# Patient Record
Sex: Female | Born: 2000 | Race: White | Hispanic: Yes | Marital: Single | Smoking: Never smoker
Health system: Southern US, Community
[De-identification: ages and names within clinical notes are randomized; demographics above are authoritative.]

## PROBLEM LIST (undated history)

## (undated) DIAGNOSIS — R519 Headache, unspecified: Secondary | ICD-10-CM

## (undated) DIAGNOSIS — K5900 Constipation, unspecified: Secondary | ICD-10-CM

## (undated) DIAGNOSIS — T7840XA Allergy, unspecified, initial encounter: Secondary | ICD-10-CM

## (undated) DIAGNOSIS — R51 Headache: Secondary | ICD-10-CM

## (undated) DIAGNOSIS — J302 Other seasonal allergic rhinitis: Secondary | ICD-10-CM

## (undated) HISTORY — DX: Allergy, unspecified, initial encounter: T78.40XA

## (undated) HISTORY — PX: UPPER GI ENDOSCOPY: SHX6162

## (undated) HISTORY — DX: Headache: R51

## (undated) HISTORY — DX: Constipation, unspecified: K59.00

## (undated) HISTORY — PX: NO PAST SURGERIES: SHX2092

## (undated) HISTORY — DX: Headache, unspecified: R51.9

---
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
## 2021-02-02 ENCOUNTER — Encounter (HOSPITAL_BASED_OUTPATIENT_CLINIC_OR_DEPARTMENT_OTHER): Payer: Self-pay

## 2021-02-02 ENCOUNTER — Other Ambulatory Visit: Payer: Self-pay

## 2021-02-02 DIAGNOSIS — R509 Fever, unspecified: Secondary | ICD-10-CM | POA: Diagnosis present

## 2021-02-02 DIAGNOSIS — Z20822 Contact with and (suspected) exposure to covid-19: Secondary | ICD-10-CM | POA: Insufficient documentation

## 2021-02-02 DIAGNOSIS — J101 Influenza due to other identified influenza virus with other respiratory manifestations: Secondary | ICD-10-CM | POA: Diagnosis not present

## 2021-02-02 LAB — RESP PANEL BY RT-PCR (FLU A&B, COVID) ARPGX2
Influenza A by PCR: POSITIVE — AB
Influenza B by PCR: NEGATIVE
SARS Coronavirus 2 by RT PCR: NEGATIVE

## 2021-02-02 NOTE — ED Triage Notes (Signed)
Patient here POV from Home with Cough, Sore Throat, Fever, Body Aches, and Nausea.  1G of Tylenol taken at 1900.  Symptoms began Monday and have not subsided Since.Subjective Fevers at Home. NAD Noted during Triage. A&Ox4. GCS 15.

## 2021-02-03 ENCOUNTER — Emergency Department (HOSPITAL_BASED_OUTPATIENT_CLINIC_OR_DEPARTMENT_OTHER)
Admission: EM | Admit: 2021-02-03 | Discharge: 2021-02-03 | Disposition: A | Discharge status: Home / Self Care | Payer: Medicaid Other | Attending: Emergency Medicine | Admitting: Emergency Medicine

## 2021-02-03 DIAGNOSIS — J101 Influenza due to other identified influenza virus with other respiratory manifestations: Secondary | ICD-10-CM

## 2021-02-03 MED ORDER — GUAIFENESIN 100 MG/5ML PO LIQD: 100.0000 mg | ORAL | 0 refills | Status: DC | PRN

## 2021-02-03 MED ORDER — ONDANSETRON HCL 4 MG PO TABS
4.0000 mg | ORAL_TABLET | Freq: Three times a day (TID) | ORAL | 0 refills | Status: DC | PRN
Start: 2021-02-03 — End: 2021-03-16

## 2021-02-03 MED ORDER — ACETAMINOPHEN 500 MG PO TABS
1000.0000 mg | ORAL_TABLET | Freq: Once | ORAL | Status: AC
  Administered 2021-02-03: 1000 mg via ORAL
  Filled 2021-02-03: qty 2

## 2021-02-03 NOTE — ED Provider Notes (Signed)
[facility] EMERGENCY DEPT Provider Note  CSN: [id number] Arrival date & time: 02/02/21 2231  Chief Complaint(s) Fever  HPI [name] is a 20 y.o. female    Fever Temp source:  Oral Severity:  Moderate Onset quality:  Gradual Duration:  3 days Timing:  Constant Progression:  Waxing and waning Chronicity:  New Relieved by:  Acetaminophen Worsened by:  Nothing Associated symptoms: chills, congestion, cough, headaches, myalgias, rhinorrhea and vomiting   Risk factors: sick contacts    Past Medical History Past Medical History:  Diagnosis Date   Allergy    Phreesia 09/18/2019   Constipation    Headache    Seasonal allergies    Patient Active Problem List   Diagnosis Date Noted   Chest pain 06/28/2020   Swelling of both hands 09/18/2019   Bilateral swelling of feet 09/18/2019   Eosinophilic esophagitis 05/23/2018   Viral URI 05/20/2018   Vaccine reaction, initial encounter 03/18/2018   Generalized abdominal pain 01/10/2018   Gastroesophageal reflux disease 01/10/2018   Migraine without aura and without status migrainosus, not intractable 01/05/2016   Poor posture 12/01/2015   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 Authorizing Provider  guaiFENesin (ROBITUSSIN) 100 MG/5ML liquid Take 5-10 mLs (100-200 mg total) by mouth every 4 (four) hours as needed for cough or to loosen phlegm. 02/03/21  Yes Cheney Gosch, Amadeo Garnet, MD  cetirizine (ZYRTEC ALLERGY) 10 MG tablet Take 1 tablet (10 mg total) by mouth daily. 11/20/20   Rhys Martini, PA-C  fluconazole (DIFLUCAN) 150 MG tablet Take 1 tablet (150 mg total) by mouth daily. Take 1 pill today, and second pill on day 3 11/20/20   Rhys Martini, PA-C  fluticasone [facility]) 50 MCG/ACT nasal spray Place 2 sprays into both nostrils daily. 11/20/20   Rhys Martini, PA-C   metroNIDAZOLE (FLAGYL) 500 MG tablet Take 1 tablet (500 mg total) by mouth 2 (two) times daily. 11/20/20   Rhys Martini, PA-C  ondansetron (ZOFRAN) 4 MG tablet Take 1 tablet (4 mg total) by mouth every 8 (eight) hours as needed for nausea or vomiting. 02/03/21   CardamaAmadeo Garnet, MD                                                                                                                                    Past Surgical History Past Surgical History:  Procedure Laterality Date   NO PAST SURGERIES     UPPER GI ENDOSCOPY     Family History Family History  Problem Relation Age of Onset   Hypertension Mother    Liver disease Father    Hyperlipidemia Father    Cancer Maternal Grandmother    Cancer Maternal Grandfather    Cirrhosis Paternal Grandfather    Gallbladder disease Sister    GI  problems Neg Hx     Social History Social History   Tobacco Use   Smoking status: Never   Smokeless tobacco: Never  Vaping Use   Vaping Use: Never used  Substance Use Topics   Alcohol use: No   Drug use: No   Allergies Salami [pickled meat] and Other  Review of Systems Review of Systems  Constitutional:  Positive for chills and fever.  HENT:  Positive for congestion and rhinorrhea.   Respiratory:  Positive for cough.   Gastrointestinal:  Positive for vomiting.  Musculoskeletal:  Positive for myalgias.  Neurological:  Positive for headaches.  All other systems are reviewed and are negative for acute change except as noted in the HPI  Physical Exam Vital Signs  I have reviewed the triage vital signs BP 117/60 (BP Location: Right Arm)   Pulse (!) 139   Temp (!) 100.9 F (38.3 C) (Oral)   Resp 18   SpO2 100%   Physical Exam Vitals reviewed.  Constitutional:      General: She is not in acute distress.    Appearance: She is well-developed. She is not diaphoretic.  HENT:     Head: Normocephalic and atraumatic.     Nose: Nose normal.  Eyes:     General: No scleral  icterus.       Right eye: No discharge.        Left eye: No discharge.     Conjunctiva/sclera: Conjunctivae normal.     Pupils: Pupils are equal, round, and reactive to light.  Cardiovascular:     Rate and Rhythm: Normal rate and regular rhythm.     Heart sounds: No murmur heard.   No friction rub. No gallop.  Pulmonary:     Effort: Pulmonary effort is normal. No respiratory distress.     Breath sounds: Normal breath sounds. No stridor. No rales.  Abdominal:     General: There is no distension.     Palpations: Abdomen is soft.     Tenderness: There is no abdominal tenderness.  Musculoskeletal:        General: No tenderness.     Cervical back: Normal range of motion and neck supple.  Skin:    General: Skin is warm and dry.     Findings: No erythema or rash.  Neurological:     Mental Status: She is alert and oriented to person, place, and time.    ED Results and Treatments Labs (all labs ordered are listed, but only abnormal results are displayed) Labs Reviewed  RESP PANEL BY RT-PCR (FLU A&B, COVID) ARPGX2 - Abnormal; Notable for the following components:      Result Value   Influenza A by PCR POSITIVE (*)    All other components within normal limits                                                                                                                         EKG  EKG Interpretation  Date/Time:  Ventricular Rate:    PR Interval:    QRS Duration:   QT Interval:    QTC Calculation:   R Axis:     Text Interpretation:         Radiology No results found.  Pertinent labs & imaging results that were available during my care of the patient were reviewed by me and considered in my medical decision making (see MDM for details).  Medications Ordered in ED Medications  acetaminophen (TYLENOL) tablet 1,000 mg (has no administration in time range)                                                                                                                                      Procedures Procedures  (including critical care time)  Medical Decision Making / ED Course I have reviewed the nursing notes for this encounter and the patient's prior records (if available in EHR or on provided paperwork).  Dawn [name] was evaluated in Emergency Department on 02/03/2021 for the symptoms described in the history of present illness. She was evaluated in the context of the global COVID-19 pandemic, which necessitated consideration that the patient might be at risk for infection with the SARS-CoV-2 virus that causes COVID-19. Institutional protocols and algorithms that pertain to the evaluation of patients at risk for COVID-19 are in a state of rapid change based on information released by regulatory bodies including the CDC and federal and state organizations. These policies and algorithms were followed during the patient's care in the ED.     20 y.o. female presents with flu-like symptoms for 3 days. Adequate oral hydration. Rest of history as above.  Patient appears well. No signs of toxicity, patient is interactive. No hypoxia, tachypnea or other signs of respiratory distress. No sign of clinical dehydration. Lung exam clear. Rest of exam as above.  Most consistent with flu-like illness. Influenza A +.   No evidence suggestive of pharyngitis, AOM, PNA, or meningitis.  Chest x-ray not indicated at this time.  Discussed symptomatic treatment with the patient and they will follow closely with their PCP.   Pertinent labs & imaging results that were available during my care of the patient were reviewed by me and considered in my medical decision making:    Final Clinical Impression(s) / ED Diagnoses Final diagnoses:  Influenza A   The patient appears reasonably screened and/or stabilized for discharge and I doubt any other medical condition or other [facility] requiring further screening, evaluation, or treatment in the ED at this time prior to discharge.  Safe for discharge with strict return precautions.  Disposition: Discharge  Condition: Good  I have discussed the results, Dx and Tx plan with the patient/family who expressed understanding and agree(s) with the plan. Discharge instructions discussed at length. The patient/family was given strict return precautions who verbalized understanding of the instructions. No further questions at time of discharge.    ED  Discharge Orders          Ordered    ondansetron (ZOFRAN) 4 MG tablet  Every 8 hours PRN        02/03/21 0312    guaiFENesin (ROBITUSSIN) 100 MG/5ML liquid  Every 4 hours PRN        02/03/21 1610              Follow Up: Theadore Nan, MD [address] [place] [address] [place] [address] [phone]  Call  to schedule an appointment for close follow up     This chart was dictated using voice recognition software.  Despite best efforts to proofread,  errors can occur which can change the documentation meaning.    Nira Conn, MD 02/03/21 [phone]

## 2021-02-03 NOTE — Discharge Instructions (Addendum)
You may take over-the-counter medicine for symptomatic relief, such as Tylenol, Motrin, TheraFlu, Alka seltzer , black elderberry, etc. Please limit acetaminophen (Tylenol) to 4000 mg and Ibuprofen (Motrin, Advil, etc.) to 2400 mg for a 24hr period. Please note that other over-the-counter medicine may contain acetaminophen or ibuprofen as a component of their ingredients.   

## 2021-02-04 ENCOUNTER — Telehealth: Payer: Self-pay

## 2021-02-04 ENCOUNTER — Other Ambulatory Visit: Payer: Self-pay

## 2021-02-04 ENCOUNTER — Encounter (HOSPITAL_BASED_OUTPATIENT_CLINIC_OR_DEPARTMENT_OTHER): Payer: Self-pay

## 2021-02-04 DIAGNOSIS — R197 Diarrhea, unspecified: Secondary | ICD-10-CM | POA: Insufficient documentation

## 2021-02-04 DIAGNOSIS — R519 Headache, unspecified: Secondary | ICD-10-CM | POA: Diagnosis present

## 2021-02-04 DIAGNOSIS — R1110 Vomiting, unspecified: Secondary | ICD-10-CM | POA: Insufficient documentation

## 2021-02-04 DIAGNOSIS — J111 Influenza due to unidentified influenza virus with other respiratory manifestations: Secondary | ICD-10-CM | POA: Diagnosis not present

## 2021-02-04 DIAGNOSIS — J101 Influenza due to other identified influenza virus with other respiratory manifestations: Secondary | ICD-10-CM | POA: Diagnosis not present

## 2021-02-04 NOTE — ED Triage Notes (Signed)
Pt was dx with Flu A two days ago and present for ongoing symptoms. Pt c/o nausea/vomiting/diarrhea and headache with fever. Pt has taken zofran and tylenol with minimal relief. Pt states she has not been able to eat or drink anything in two days.

## 2021-02-04 NOTE — Telephone Encounter (Signed)
Transition Care Management Follow-up Telephone Call Date of discharge and from where: 02/03/2021-[facility]  How have you been since you were released from the hospital? Patient stated she is doing ok, resting to get better. Any questions or concerns? No  Items Reviewed: Did the pt receive and understand the discharge instructions provided? Yes  Medications obtained and verified? Yes  Other? No  Any new allergies since your discharge? No  Dietary orders reviewed? No Do you have support at home? Yes   Home Care and Equipment/Supplies: Were home health services ordered? not applicable If so, what is the name of the agency? N/A  Has the agency set up a time to come to the patient's home? not applicable Were any new equipment or medical supplies ordered?  No What is the name of the medical supply agency? N/A Were you able to get the supplies/equipment? not applicable Do you have any questions related to the use of the equipment or supplies? No  Functional Questionnaire: (I = Independent and D = Dependent) ADLs: I  Bathing/Dressing- I  Meal Prep- I  Eating- I  Maintaining continence- I  Transferring/Ambulation- I  Managing Meds- I  Follow up appointments reviewed:  PCP Hospital f/u appt confirmed? No   Specialist Hospital f/u appt confirmed? No   Are transportation arrangements needed? No  If their condition worsens, is the pt aware to call PCP or go to the Emergency Dept.? Yes Was the patient provided with contact information for the PCP's office or ED? Yes Was to pt encouraged to call back with questions or concerns? Yes

## 2021-02-05 ENCOUNTER — Emergency Department (HOSPITAL_BASED_OUTPATIENT_CLINIC_OR_DEPARTMENT_OTHER): Payer: Medicaid Other

## 2021-02-05 ENCOUNTER — Emergency Department (HOSPITAL_BASED_OUTPATIENT_CLINIC_OR_DEPARTMENT_OTHER)
Admission: EM | Admit: 2021-02-05 | Discharge: 2021-02-05 | Disposition: A | Discharge status: Home / Self Care | Payer: Medicaid Other | Attending: Emergency Medicine | Admitting: Emergency Medicine

## 2021-02-05 DIAGNOSIS — R197 Diarrhea, unspecified: Secondary | ICD-10-CM

## 2021-02-05 DIAGNOSIS — J111 Influenza due to unidentified influenza virus with other respiratory manifestations: Secondary | ICD-10-CM | POA: Diagnosis not present

## 2021-02-05 DIAGNOSIS — R1110 Vomiting, unspecified: Secondary | ICD-10-CM

## 2021-02-05 LAB — BASIC METABOLIC PANEL
Anion gap: 10 (ref 5–15)
BUN: 8 mg/dL (ref 6–20)
CO2: 28 mmol/L (ref 22–32)
Calcium: 9 mg/dL (ref 8.9–10.3)
Chloride: 100 mmol/L (ref 98–111)
Creatinine, Ser: 0.73 mg/dL (ref 0.44–1.00)
GFR, Estimated: >60 mL/min (ref >60)
Glucose, Bld: 97 mg/dL (ref 70–99)
Potassium: 3.4 mmol/L — ABNORMAL LOW (ref 3.5–5.1)
Sodium: 138 mmol/L (ref 135–145)

## 2021-02-05 LAB — CBC WITH DIFFERENTIAL/PLATELET
Abs Immature Granulocytes: 0.01 10*3/uL (ref 0.00–0.07)
Basophils Absolute: 0 10*3/uL (ref 0.0–0.1)
Basophils Relative: 0 %
Eosinophils Absolute: 0 10*3/uL (ref 0.0–0.5)
Eosinophils Relative: 1 %
HCT: 41.8 % (ref 36.0–46.0)
Hemoglobin: 13.8 g/dL (ref 12.0–15.0)
Immature Granulocytes: 0 %
Lymphocytes Relative: 28 %
Lymphs Abs: 1 10*3/uL (ref 0.7–4.0)
MCH: 29.9 pg (ref 26.0–34.0)
MCHC: 33 g/dL (ref 30.0–36.0)
MCV: 90.7 fL (ref 80.0–100.0)
Monocytes Absolute: 0.3 10*3/uL (ref 0.1–1.0)
Monocytes Relative: 8 %
Neutro Abs: 2.2 10*3/uL (ref 1.7–7.7)
Neutrophils Relative %: 63 %
Platelets: 245 10*3/uL (ref 150–400)
RBC: 4.61 MIL/uL (ref 3.87–5.11)
RDW: 11.9 % (ref 11.5–15.5)
WBC: 3.6 10*3/uL — ABNORMAL LOW (ref 4.0–10.5)
nRBC: 0 % (ref 0.0–0.2)

## 2021-02-05 LAB — HCG, QUANTITATIVE, PREGNANCY: hCG, Beta Chain, Quant, S: <1 m[IU]/mL (ref <5)

## 2021-02-05 MED ORDER — KETOROLAC TROMETHAMINE 30 MG/ML IJ SOLN
30.0000 mg | Freq: Once | INTRAMUSCULAR | Status: AC
  Administered 2021-02-05: 30 mg via INTRAVENOUS
  Filled 2021-02-05: qty 1

## 2021-02-05 MED ORDER — SODIUM CHLORIDE 0.9 % IV BOLUS
1000.0000 mL | Freq: Once | INTRAVENOUS | Status: AC
  Administered 2021-02-05: 1000 mL via INTRAVENOUS

## 2021-02-05 MED ORDER — ONDANSETRON HCL 4 MG/2ML IJ SOLN
4.0000 mg | Freq: Once | INTRAMUSCULAR | Status: AC
  Administered 2021-02-05: 4 mg via INTRAVENOUS
  Filled 2021-02-05: qty 2

## 2021-02-05 MED ORDER — ACETAMINOPHEN 325 MG PO TABS
650.0000 mg | ORAL_TABLET | Freq: Once | ORAL | Status: AC
  Administered 2021-02-05: 650 mg via ORAL
  Filled 2021-02-05: qty 2

## 2021-02-05 NOTE — ED Provider Notes (Signed)
[facility] EMERGENCY DEPT Provider Note   CSN: [id number] Arrival date & time: 02/04/21  2323     History Chief Complaint  Patient presents with   Emesis   Diarrhea   Headache    Dawn [name] is a 20 y.o. female.  The history is provided by the patient.  Influenza Presenting symptoms: cough, diarrhea, fatigue, fever, headache, myalgias, nausea, shortness of breath and vomiting   Severity:  Moderate Onset quality:  Gradual Progression:  Worsening Chronicity:  New Relieved by:  Nothing Worsened by:  Nothing Associated symptoms: chills and decreased appetite   Patient is an otherwise healthy female who presents with flulike illness.  Patient reports her symptoms started over 5 days ago.  She reports cough, fever, chills and headache.  She was seen in the ER and diagnosed with flu on 226.  Since that time she has had worsening symptoms including vomiting and diarrhea.  She reports she is unable to take any oral fluids.     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     Family History  Problem Relation Age of Onset   Hypertension Mother    Liver disease Father    Hyperlipidemia Father     Cancer Maternal Grandmother    Cancer Maternal Grandfather    Cirrhosis Paternal Grandfather    Gallbladder disease Sister    GI problems Neg Hx     Social History   Tobacco Use   Smoking status: Never   Smokeless tobacco: Never  Vaping Use   Vaping Use: Never used  Substance Use Topics   Alcohol use: No   Drug use: No    Home Medications Prior to Admission medications   Medication Sig Start Date End Date Taking? Authorizing Provider  cetirizine (ZYRTEC ALLERGY) 10 MG tablet Take 1 tablet (10 mg total) by mouth daily. 11/20/20   Rhys Martini, PA-C  fluticasone (FLONASE) 50 MCG/ACT nasal spray Place 2 sprays into both nostrils daily. 11/20/20   Rhys Martini, PA-C  guaiFENesin (ROBITUSSIN) 100 MG/5ML liquid Take 5-10 mLs (100-200 mg total) by mouth every 4 (four) hours as needed for cough or to loosen phlegm. 02/03/21   Nira Conn, MD  ondansetron (ZOFRAN) 4 MG tablet Take 1 tablet (4 mg total) by mouth every 8 (eight) hours as needed for nausea or vomiting. 02/03/21   Cardama, Amadeo Garnet, MD    Allergies    Salami [pickled meat] and Other  Review of Systems   Review of Systems  Constitutional:  Positive for chills, decreased appetite, fatigue and fever.  Respiratory:  Positive for cough and shortness of breath.  Gastrointestinal:  Positive for diarrhea, nausea and vomiting.  Musculoskeletal:  Positive for myalgias.  Neurological:  Positive for headaches.  All other systems reviewed and are negative.  Physical Exam Updated Vital Signs BP 104/78 (BP Location: Right Arm)   Pulse 89   Temp 99.2 F (37.3 C) (Oral)   Resp 18   Ht 1.575 m (5\' 2" )   Wt 77.1 kg   LMP 01/21/2021 (Approximate)   SpO2 98%   BMI 31.09 kg/m   Physical Exam CONSTITUTIONAL: Well developed/well nourished HEAD: Normocephalic/atraumatic EYES: EOMI/PERRL ENMT: Mucous membranes moist, uvula midline, no stridor or drooling NECK: supple no meningeal signs SPINE/BACK:entire  spine nontender CV: S1/S2 noted, no murmurs/rubs/gallops noted LUNGS: Lungs are clear to auscultation bilaterally, no apparent distress ABDOMEN: soft, nontender, no rebound or guarding, bowel sounds noted throughout abdomen GU:no cva tenderness NEURO: Pt is awake/alert/appropriate, moves all extremitiesx4.  No facial droop.   EXTREMITIES: pulses normal/equal, full ROM SKIN: warm, color normal PSYCH: no abnormalities of mood noted, alert and oriented to situation  ED Results / Procedures / Treatments   Labs (all labs ordered are listed, but only abnormal results are displayed) Labs Reviewed  BASIC METABOLIC PANEL - Abnormal; Notable for the following components:      Result Value   Potassium 3.4 (*)    All other components within normal limits  CBC WITH DIFFERENTIAL/PLATELET - Abnormal; Notable for the following components:   WBC 3.6 (*)    All other components within normal limits  HCG, QUANTITATIVE, PREGNANCY    EKG None  Radiology DG Chest Portable 1 View  Result Date: 02/05/2021 CLINICAL DATA:  Influenza EXAM: PORTABLE CHEST 1 VIEW COMPARISON:  12/22/2019 FINDINGS: Normal heart size and mediastinal contours. No acute infiltrate or edema. No effusion or pneumothorax. No acute osseous findings. IMPRESSION: Negative chest. Electronically Signed   By: 12/24/2019 M.D.   On: 02/05/2021 04:54    Procedures Procedures   Medications Ordered in ED Medications  sodium chloride 0.9 % bolus 1,000 mL (0 mLs Intravenous Stopped 02/05/21 0413)  ondansetron (ZOFRAN) injection 4 mg (4 mg Intravenous Given 02/05/21 0344)  ketorolac (TORADOL) 30 MG/ML injection 30 mg (30 mg Intravenous Given 02/05/21 0413)  sodium chloride 0.9 % bolus 1,000 mL (0 mLs Intravenous Stopped 02/05/21 0630)  acetaminophen (TYLENOL) tablet 650 mg (650 mg Oral Given 02/05/21 0535)    ED Course  I have reviewed the triage vital signs and the nursing notes.  Pertinent labs & imaging results that were  available during my care of the patient were reviewed by me and considered in my medical decision making (see chart for details).    MDM Rules/Calculators/A&P                           Patient is a flulike symptoms for several days.  She was are seen in the ER for symptoms but worsened at home.  She also reports associated vomiting and diarrhea.  Patient did appear mildly dehydrated.  She was given 2 L of IV fluid is now feeling improved.  She is ambulatory and is now producing urine. I personally reviewed her chest x-ray and is negative Patient is otherwise low risk and appropriate for outpatient management. Final Clinical Impression(s) / ED Diagnoses Final diagnoses:  Influenza  Vomiting and diarrhea    Rx / DC Orders ED Discharge Orders     None        02/07/21, MD 02/05/21  0646  

## 2021-02-07 ENCOUNTER — Telehealth: Payer: Self-pay

## 2021-02-07 NOTE — Telephone Encounter (Signed)
Transition Care Management Unsuccessful Follow-up Telephone Call  Date of discharge and from where:  02/05/2021-[facility]  Attempts:  1st Attempt  Reason for unsuccessful TCM follow-up call:  Left voice message

## 2021-02-08 NOTE — Telephone Encounter (Signed)
Transition Care Management Unsuccessful Follow-up Telephone Call  Date of discharge and from where:  02/05/2021 from [facility]  Attempts:  2nd Attempt  Reason for unsuccessful TCM follow-up call:  Unable to reach patient

## 2021-02-09 NOTE — Telephone Encounter (Signed)
Transition Care Management Follow-up Telephone Call Date of discharge and from where: 02/05/2021 from [facility] How have you been since you were released from the hospital? Pt stated that she is feeling much better. Pt did not have any questions or concerns.  Any questions or concerns? No  Items Reviewed: Did the pt receive and understand the discharge instructions provided? Yes  Medications obtained and verified? Yes  Other? No  Any new allergies since your discharge? No  Dietary orders reviewed? No Do you have support at home? Yes   Functional Questionnaire: (I = Independent and D = Dependent) ADLs: I  Bathing/Dressing- I  Meal Prep- I  Eating- I  Maintaining continence- I  Transferring/Ambulation- I  Managing Meds- I   Follow up appointments reviewed:  PCP Hospital f/u appt confirmed? No   Specialist Hospital f/u appt confirmed? No  Are transportation arrangements needed? No  If their condition worsens, is the pt aware to call PCP or go to the Emergency Dept.? Yes Was the patient provided with contact information for the PCP's office or ED? Yes Was to pt encouraged to call back with questions or concerns? Yes

## 2021-03-16 ENCOUNTER — Encounter: Payer: Self-pay | Admitting: Pediatrics

## 2021-03-16 ENCOUNTER — Other Ambulatory Visit: Payer: Self-pay

## 2021-03-16 ENCOUNTER — Ambulatory Visit (INDEPENDENT_AMBULATORY_CARE_PROVIDER_SITE_OTHER): Payer: Medicaid Other | Admitting: Pediatrics

## 2021-03-16 VITALS — Temp 97.1°F | Ht 62.5 in | Wt 168.2 lb

## 2021-03-16 DIAGNOSIS — Z111 Encounter for screening for respiratory tuberculosis: Secondary | ICD-10-CM | POA: Diagnosis not present

## 2021-03-16 DIAGNOSIS — Z23 Encounter for immunization: Secondary | ICD-10-CM | POA: Diagnosis not present

## 2021-03-16 NOTE — Patient Instructions (Signed)
Adult Primary Care Clinics Name Criteria Services   [facility]  Address: [address] [place], [place] [address]  Phone: [phone] Hours: Monday - Friday 9 AM -6 PM    Not currently taking new Patients, due to move into [facility], expected new patient acceptance time March/April 2023  Types of insurance accepted:  Commercial insurance [facility] (orange card) Medicaid Medicare Uninsured  Language services:  Video and phone interpreters available   Ages 18 and older    Adult primary care Onsite pharmacy Integrated behavioral health Financial assistance counseling Walk-in hours for established patients  Financial assistance counseling hours: Tuesdays 2:00PM - 5:00PM  Thursday 8:30AM - 4:30PM  Space is limited, 10 on Tuesday and 20 on Thursday. It's on first come first serve basis  Name Criteria Services   [facility]  Address: [address] [place], [place] [address]  Phone: [phone]  Hours: Monday - Friday 8:30 AM - 5 PM  Types of insurance accepted:  Commercial insurance Medicaid Medicare Uninsured  Language services:  Video and phone interpreters available   All ages - newborn to adult   Primary care for all ages (children and adults) Integrated behavioral health Nutritionist Financial assistance counseling   Name Criteria Services   [facility]  Located on the ground floor of [facility]  Address: [address]  [place],  [place]  [address]  Phone: [phone]  Hours: Monday - Friday 8:15 AM - 5 PM  Types of insurance accepted:  Commercial insurance Medicaid Medicare Uninsured  Language services:  Video and phone interpreters available   Ages 18 and older   Adult primary care Nutritionist Certified Diabetes Educator  Integrated behavioral health Financial assistance counseling   Name  Criteria Services   [facility]  Address: [address] [place], [place] [address]  Phone: [phone]  Hours: Monday - Friday 8:30 AM - 5 PM    Types of insurance accepted:  Commercial insurance Medicaid Medicare Uninsured  Language services:  Video and phone interpreters available   All ages - newborn to adult   Primary care for all ages (children and adults) Integrated behavioral health Financial assistance counseling    

## 2021-03-16 NOTE — Progress Notes (Signed)
   Subjective:     Dawn [name], is a 20 y.o. 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 Need for vaccination Consent form patient - Flu Vaccine QUAD 46mo+IM (Fluarix, Fluzone & Alfiuria Quad PF)  2. Screening for tuberculosis  For education/ employment screening, no symmptoms  - QuantiFERON-TB Gold Plus  Spent  10  minutes reviewing charts, discussing diagnosis and treatment plan with patient, documentation   Theadore Nan, MD

## 2021-03-18 LAB — QUANTIFERON-TB GOLD PLUS
Mitogen-NIL: >10 IU/mL
NIL: 0.03 IU/mL
QuantiFERON-TB Gold Plus: NEGATIVE
TB1-NIL: 0 IU/mL
TB2-NIL: 0 IU/mL

## 2021-04-17 ENCOUNTER — Other Ambulatory Visit: Payer: Self-pay

## 2021-04-17 ENCOUNTER — Ambulatory Visit (HOSPITAL_COMMUNITY)
Admission: EM | Admit: 2021-04-17 | Discharge: 2021-04-17 | Disposition: A | Discharge status: Home / Self Care | Payer: Medicaid Other | Attending: Physician Assistant | Admitting: Physician Assistant

## 2021-04-17 ENCOUNTER — Encounter (HOSPITAL_COMMUNITY): Payer: Self-pay

## 2021-04-17 DIAGNOSIS — N3000 Acute cystitis without hematuria: Secondary | ICD-10-CM | POA: Diagnosis not present

## 2021-04-17 LAB — POCT URINALYSIS DIPSTICK, ED / UC
Bilirubin Urine: NEGATIVE
Glucose, UA: NEGATIVE mg/dL
Ketones, ur: NEGATIVE mg/dL
Nitrite: NEGATIVE
Protein, ur: 30 mg/dL — AB
Specific Gravity, Urine: 1.025 (ref 1.005–1.030)
Urobilinogen, UA: 0.2 mg/dL (ref 0.0–1.0)
pH: 6 (ref 5.0–8.0)

## 2021-04-17 MED ORDER — NITROFURANTOIN MONOHYD MACRO 100 MG PO CAPS: 100.0000 mg | ORAL_CAPSULE | Freq: Two times a day (BID) | ORAL | 0 refills | Status: DC

## 2021-04-17 NOTE — Discharge Instructions (Signed)
Take medication as prescribed Drink plenty of fluids Return if symptoms become worse

## 2021-04-17 NOTE — ED Triage Notes (Signed)
Pt presents with burning during urination and blood in urine since waking up this morning.

## 2021-04-17 NOTE — ED Provider Notes (Signed)
[place]    CSN: [id number] Arrival date & time: 04/17/21  1344      History   Chief Complaint Chief Complaint  Patient presents with   UTI    HPI Dawn [name] is a 21 y.o. female.   PT complains of dysuria that started this morning.  Reports increased urinary frequency and urgency.  She has noted scant blood in urine.  She has taken cranberry capsules.  Denies fever, chills, n/v/d, abdominal pain, flank pain.    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      Home Medications    Prior to Admission medications   Medication Sig Start Date End Date Taking? Authorizing Provider  nitrofurantoin, macrocrystal-monohydrate, (MACROBID) 100 MG capsule Take 1 capsule (100 mg total) by mouth 2 (two) times daily. 04/17/21  Yes Ward, Lenise Arena, PA-C  cetirizine (ZYRTEC ALLERGY) 10 MG tablet Take 1 tablet (10 mg total) by mouth daily. 11/20/20   Hazel Sams, PA-C  fluticasone (FLONASE) 50 MCG/ACT nasal spray Place 2 sprays into both nostrils daily. 11/20/20   Hazel Sams, PA-C    Family History Family History  Problem Relation Age of Onset   Hypertension Mother    Liver disease Father     Hyperlipidemia Father    Cancer Maternal Grandmother    Cancer Maternal Grandfather    Cirrhosis Paternal Grandfather    Gallbladder disease Sister    GI problems Neg Hx     Social History Social History   Tobacco Use   Smoking status: Never   Smokeless tobacco: Never  Vaping Use   Vaping Use: Never used  Substance Use Topics   Alcohol use: No   Drug use: No     Allergies   Salami [pickled meat] and Other   Review of Systems Review of Systems  Constitutional:  Negative for chills and fever.  HENT:  Negative for ear pain and sore throat.   Eyes:  Negative for pain and visual disturbance.  Respiratory:  Negative for cough and shortness of breath.   Cardiovascular:  Negative for chest pain and palpitations.  Gastrointestinal:  Negative for abdominal pain and vomiting.  Genitourinary:  Positive for dysuria, frequency and urgency. Negative for hematuria.  Musculoskeletal:  Negative for arthralgias and back pain.  Skin:  Negative for color change and rash.  Neurological:  Negative for seizures and syncope.  All other systems reviewed and are negative.   Physical Exam Triage Vital Signs ED Triage Vitals  Enc Vitals Group     BP 04/17/21 1424 120/70     Pulse Rate 04/17/21 1424 93  Resp 04/17/21 1424 18     Temp 04/17/21 1424 99.5 F (37.5 C)     Temp Source 04/17/21 1424 Oral     SpO2 04/17/21 1424 98 %     Weight --      Height --      Head Circumference --      Peak Flow --      Pain Score 04/17/21 1426 4     Pain Loc --      Pain Edu? --      Excl. in GC? --    No data found.  Updated Vital Signs BP 120/70 (BP Location: Left Arm)    Pulse 93    Temp 99.5 F (37.5 C) (Oral)    Resp 18    LMP 04/03/2021    SpO2 98%   Visual Acuity Right Eye Distance:   Left Eye Distance:   Bilateral Distance:    Right Eye Near:   Left Eye Near:    Bilateral Near:     Physical Exam Vitals and nursing note reviewed.  Constitutional:      General: She is not in  acute distress.    Appearance: She is well-developed.  HENT:     Head: Normocephalic and atraumatic.  Eyes:     Conjunctiva/sclera: Conjunctivae normal.  Cardiovascular:     Rate and Rhythm: Normal rate and regular rhythm.     Heart sounds: No murmur heard. Pulmonary:     Effort: Pulmonary effort is normal. No respiratory distress.     Breath sounds: Normal breath sounds.  Abdominal:     Palpations: Abdomen is soft.     Tenderness: There is no abdominal tenderness.  Musculoskeletal:        General: No swelling.     Cervical back: Neck supple.  Skin:    General: Skin is warm and dry.     Capillary Refill: Capillary refill takes less than 2 seconds.  Neurological:     Mental Status: She is alert.  Psychiatric:        Mood and Affect: Mood normal.     UC Treatments / Results  Labs (all labs ordered are listed, but only abnormal results are displayed) Labs Reviewed  POCT URINALYSIS DIPSTICK, ED / UC - Abnormal; Notable for the following components:      Result Value   Hgb urine dipstick LARGE (*)    Protein, ur 30 (*)    Leukocytes,Ua MODERATE (*)    All other components within normal limits    EKG   Radiology No results found.  Procedures Procedures (including critical care time)  Medications Ordered in UC Medications - No data to display  Initial Impression / Assessment and Plan / UC Course  I have reviewed the triage vital signs and the nursing notes.  Pertinent labs & imaging results that were available during my care of the patient were reviewed by me and considered in my medical decision making (see chart for details).     Will treat for UTI, no CVA tenderness, no fever, chills.  Return precautions discussed.  Final Clinical Impressions(s) / UC Diagnoses   Final diagnoses:  Acute cystitis without hematuria     Discharge Instructions      Take medication as prescribed Drink plenty of fluids Return if symptoms become worse   ED Prescriptions      Medication Sig Dispense Auth. Provider   nitrofurantoin, macrocrystal-monohydrate, (MACROBID) 100 MG capsule Take 1 capsule (100 mg total) by mouth  2 (two) times daily. 10 capsule Ward, Lenise Arena, PA-C      PDMP not reviewed this encounter.   Ward, Lenise Arena, PA-C 04/17/21 1500

## 2021-05-15 ENCOUNTER — Ambulatory Visit (HOSPITAL_COMMUNITY)
Admission: EM | Admit: 2021-05-15 | Discharge: 2021-05-15 | Disposition: A | Discharge status: Home / Self Care | Payer: Medicaid Other | Attending: Family Medicine | Admitting: Family Medicine

## 2021-05-15 ENCOUNTER — Encounter (HOSPITAL_COMMUNITY): Payer: Self-pay | Admitting: *Deleted

## 2021-05-15 ENCOUNTER — Other Ambulatory Visit: Payer: Self-pay

## 2021-05-15 DIAGNOSIS — Z20822 Contact with and (suspected) exposure to covid-19: Secondary | ICD-10-CM | POA: Diagnosis not present

## 2021-05-15 DIAGNOSIS — B349 Viral infection, unspecified: Secondary | ICD-10-CM | POA: Insufficient documentation

## 2021-05-15 LAB — POCT URINALYSIS DIPSTICK, ED / UC
Bilirubin Urine: NEGATIVE
Glucose, UA: NEGATIVE mg/dL
Hgb urine dipstick: NEGATIVE
Ketones, ur: NEGATIVE mg/dL
Leukocytes,Ua: NEGATIVE
Nitrite: NEGATIVE
Protein, ur: NEGATIVE mg/dL
Specific Gravity, Urine: >=1.03 (ref 1.005–1.030)
Urobilinogen, UA: 0.2 mg/dL (ref 0.0–1.0)
pH: 6 (ref 5.0–8.0)

## 2021-05-15 LAB — POC INFLUENZA A AND B ANTIGEN (URGENT CARE ONLY)
INFLUENZA A ANTIGEN, POC: NEGATIVE
INFLUENZA B ANTIGEN, POC: NEGATIVE

## 2021-05-15 LAB — POC URINE PREG, ED: Preg Test, Ur: NEGATIVE

## 2021-05-15 LAB — POCT RAPID STREP A, ED / UC: Streptococcus, Group A Screen (Direct): NEGATIVE

## 2021-05-15 LAB — SARS CORONAVIRUS 2 (TAT 6-24 HRS): SARS Coronavirus 2: NEGATIVE

## 2021-05-15 NOTE — Discharge Instructions (Addendum)
Your strep and flu testing were negative. Your urine test was negative.  Your covid test is pending.  This is most likely something viral.  You can check your my chart for results.  OTC medications recommended to include, mucinex, tylenol, ibuprofen.  Stay hydrated.

## 2021-05-15 NOTE — ED Triage Notes (Addendum)
C/O chills, back aching, severe cough (describes wheezing), sore throat, vomiting after coughing fits, HA onset 2 days ago. Also c/o left abd pain intermittently.

## 2021-05-15 NOTE — ED Provider Notes (Signed)
[facility]    CSN: [id number] Arrival date & time: 05/15/21  1152      History   Chief Complaint Chief Complaint  Patient presents with   Cough   Emesis    HPI Dawn [name] is a 21 y.o. female.   21 year old female with 2 days of cough, congestion, chills, body aches, HA, sore throat. Symptoms have been constant. No fever. Taking ASA    Cough Emesis Associated symptoms: cough    Past Medical History:  Diagnosis Date   Allergy    Phreesia 09/18/2019   Constipation    Headache    Seasonal allergies     Patient Active Problem List   Diagnosis Date Noted   Chest pain 06/28/2020   Swelling of both hands 09/18/2019   Bilateral swelling of feet 09/18/2019   Eosinophilic esophagitis 05/23/2018   Viral URI 05/20/2018   Vaccine reaction, initial encounter 03/18/2018   Generalized abdominal pain 01/10/2018   Gastroesophageal reflux disease 01/10/2018   Migraine without aura and without status migrainosus, not intractable 01/05/2016   Poor posture 12/01/2015   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      Home Medications    Prior to Admission medications   Medication Sig Start Date End Date Taking? Authorizing Provider  cetirizine (ZYRTEC ALLERGY) 10 MG tablet Take 1 tablet (10 mg total) by mouth daily. 11/20/20   Rhys Martini, PA-C  fluticasone (FLONASE) 50 MCG/ACT nasal spray Place 2 sprays into both nostrils daily. 11/20/20   Rhys Martini, PA-C  nitrofurantoin, macrocrystal-monohydrate, (MACROBID) 100 MG capsule Take 1 capsule (100 mg total) by mouth 2 (two) times daily. 04/17/21   Ward, Tylene Fantasia, PA-C    Family History Family History  Problem Relation Age of Onset   Hypertension Mother    Liver disease Father    Hyperlipidemia Father    Cancer  Maternal Grandmother    Cancer Maternal Grandfather    Cirrhosis Paternal Grandfather    Gallbladder disease Sister    GI problems Neg Hx     Social History Social History   Tobacco Use   Smoking status: Never   Smokeless tobacco: Never  Vaping Use   Vaping Use: Never used  Substance Use Topics   Alcohol use: No   Drug use: No     Allergies   Salami [pickled meat] and Other   Review of Systems Review of Systems  Respiratory:  Positive for cough.   Gastrointestinal:  Positive for vomiting.    Physical Exam Triage Vital Signs ED Triage Vitals [05/15/21 1246]  Enc Vitals Group     BP 111/75     Pulse Rate (!) 102     Resp 18     Temp 98.4 F (36.9 C)     Temp src      SpO2 98 %     Weight      Height      Head Circumference      Peak Flow      Pain Score 8     Pain Loc      Pain Edu?      Excl. in GC?    No data found.  Updated Vital Signs BP 111/75  Pulse (!) 102    Temp 98.4 F (36.9 C)    Resp 18    LMP 05/02/2021 (Exact Date)    SpO2 98%   Visual Acuity Right Eye Distance:   Left Eye Distance:   Bilateral Distance:    Right Eye Near:   Left Eye Near:    Bilateral Near:     Physical Exam Constitutional:      Appearance: Normal appearance.  HENT:     Head: Normocephalic and atraumatic.     Right Ear: Tympanic membrane and ear canal normal.     Left Ear: Tympanic membrane and ear canal normal.     Nose: Congestion present.     Mouth/Throat:     Comments: Erythema and 2+ tonsils  Eyes:     Conjunctiva/sclera: Conjunctivae normal.  Cardiovascular:     Rate and Rhythm: Regular rhythm. Tachycardia present.     Pulses: Normal pulses.     Heart sounds: Normal heart sounds.  Pulmonary:     Effort: Pulmonary effort is normal.     Breath sounds: Normal breath sounds.  Musculoskeletal:     Cervical back: Normal range of motion.  Skin:    General: Skin is warm and dry.  Neurological:     Mental Status: She is alert.  Psychiatric:         Mood and Affect: Mood normal.     UC Treatments / Results  Labs (all labs ordered are listed, but only abnormal results are displayed) Labs Reviewed  SARS CORONAVIRUS 2 (TAT 6-24 HRS)  CULTURE, GROUP A STREP [facility])  POCT URINALYSIS DIPSTICK, ED / UC  POC URINE PREG, ED  POCT RAPID STREP A, ED / UC  POC INFLUENZA A AND B ANTIGEN (URGENT CARE ONLY)    EKG   Radiology No results found.  Procedures Procedures (including critical care time)  Medications Ordered in UC Medications - No data to display  Initial Impression / Assessment and Plan / UC Course  I have reviewed the triage vital signs and the nursing notes.  Pertinent labs & imaging results that were available during my care of the patient were reviewed by me and considered in my medical decision making (see chart for details).     Viral illness- rapid flu and strep testing negative.  Covid test pending.  OTC meds as needed  Final Clinical Impressions(s) / UC Diagnoses   Final diagnoses:  Encounter for laboratory testing for COVID-19 virus  Viral illness     Discharge Instructions      Your strep and flu testing were negative. Your urine test was negative.  Your covid test is pending.  This is most likely something viral.  You can check your my chart for results.  OTC medications recommended to include, mucinex, tylenol, ibuprofen.  Stay hydrated.      ED Prescriptions   None    PDMP not reviewed this encounter.   Orvan July, NP 05/15/21 1352

## 2021-05-16 ENCOUNTER — Telehealth (HOSPITAL_COMMUNITY): Payer: Self-pay | Admitting: Emergency Medicine

## 2021-05-16 LAB — CULTURE, GROUP A STREP (THRC)

## 2021-05-16 MED ORDER — AMOXICILLIN 500 MG PO CAPS: 500.0000 mg | ORAL_CAPSULE | Freq: Two times a day (BID) | ORAL | 0 refills | Status: AC

## 2021-06-04 ENCOUNTER — Ambulatory Visit (HOSPITAL_COMMUNITY): Payer: Self-pay

## 2021-09-15 ENCOUNTER — Ambulatory Visit (HOSPITAL_COMMUNITY)
Admission: RE | Admit: 2021-09-15 | Discharge: 2021-09-15 | Disposition: A | Discharge status: Home / Self Care | Payer: Medicaid Other | Source: Ambulatory Visit | Attending: Family Medicine | Admitting: Family Medicine

## 2021-09-15 ENCOUNTER — Encounter (HOSPITAL_COMMUNITY): Payer: Self-pay

## 2021-09-15 VITALS — BP 109/75 | HR 70 | Temp 98.3°F | Resp 16

## 2021-09-15 DIAGNOSIS — N3090 Cystitis, unspecified without hematuria: Secondary | ICD-10-CM

## 2021-09-15 LAB — POCT URINALYSIS DIPSTICK, ED / UC
Bilirubin Urine: NEGATIVE
Glucose, UA: NEGATIVE mg/dL
Ketones, ur: NEGATIVE mg/dL
Nitrite: NEGATIVE
Protein, ur: 30 mg/dL — AB
Specific Gravity, Urine: 1.02 (ref 1.005–1.030)
Urobilinogen, UA: 0.2 mg/dL (ref 0.0–1.0)
pH: 7 (ref 5.0–8.0)

## 2021-09-15 MED ORDER — CEPHALEXIN 500 MG PO CAPS: 500.0000 mg | ORAL_CAPSULE | Freq: Two times a day (BID) | ORAL | 0 refills | Status: DC

## 2021-09-15 NOTE — ED Triage Notes (Signed)
Pt states urinary frequency and today has some blood in her urine. Also feels bloated and abd pain.

## 2021-09-15 NOTE — ED Provider Notes (Signed)
[facility]    ASSESSMENT & PLAN:  1. Cystitis   Begin: Meds ordered this encounter  Medications   cephALEXin (KEFLEX) 500 MG capsule    Sig: Take 1 capsule (500 mg total) by mouth 2 (two) times daily.    Dispense:  10 capsule    Refill:  0   No signs of pyelonephritis. Urine culture sent. Will follow up with her PCP or here if not showing improvement over the next 48 hours, sooner if needed.  Outlined signs and symptoms indicating need for more acute intervention. Patient verbalized understanding. After Visit Summary given.  SUBJECTIVE:  Dawn [name] is a 21 y.o. female who complains of urinary frequency, urgency and dysuria for the past couple of days. Without associated flank pain, fever, chills, vaginal discharge or bleeding. Gross hematuria: not present. No specific aggravating or alleviating factors reported. No LE edema. Normal PO intake without n/v/d. Without specific abdominal pain. Ambulatory without difficulty. OTC treatment: none. H/O UTI: infreq.  LMP: Patient's last menstrual period was 09/04/2021 (approximate).    OBJECTIVE:  Vitals:   09/15/21 1032  BP: 109/75  Pulse: 70  Resp: 16  Temp: 98.3 F (36.8 C)  TempSrc: Oral  SpO2: 98%   General appearance: alert; no distress HENT: oropharynx: moist Lungs: unlabored respirations Abdomen: soft, non-tender; bowel sounds normal; no masses or organomegaly; no guarding or rebound tenderness Back: no CVA tenderness Extremities: no edema; symmetrical with no gross deformities Skin: warm and dry Neurologic: normal gait Psychological: alert and cooperative; normal mood and affect  Labs Reviewed  POCT URINALYSIS DIPSTICK, ED / UC - Abnormal; Notable for the following components:      Result Value   Hgb urine dipstick LARGE (*)    Protein, ur 30 (*)    Leukocytes,Ua MODERATE (*)    All other components within normal limits  URINE CULTURE    Allergies  Allergen Reactions   Salami  [Pickled Meat] Itching   Other Itching    salami    Past Medical History:  Diagnosis Date   Allergy    Phreesia 09/18/2019   Constipation    Headache    Seasonal allergies    Social History   Socioeconomic History   Marital status: Single    Spouse name: Not on file   Number of children: Not on file   Years of education: Not on file   Highest education level: Not on file  Occupational History   Not on file  Tobacco Use   Smoking status: Never   Smokeless tobacco: Never  Vaping Use   Vaping Use: Never used  Substance and Sexual Activity   Alcohol use: No   Drug use: No   Sexual activity: Yes    Birth control/protection: Condom  Other Topics Concern   Not on file  Social History Narrative   Sherrilynn is an 10th grade student.   She attends [employer].   She lives with both parents and has four siblings.   She enjoys playing on her phone, sleeping, and hanging with friends.   Social Determinants of Health   Financial Resource Strain: Not on file  Food Insecurity: Not on file  Transportation Needs: Not on file  Physical Activity: Not on file  Stress: Not on file  Social Connections: Not on file  Intimate Partner Violence: Not on file   Family History  Problem Relation Age of Onset   Hypertension Mother    Liver disease Father  Hyperlipidemia Father    Cancer Maternal Grandmother    Cancer Maternal Grandfather    Cirrhosis Paternal Grandfather    Gallbladder disease Sister    GI problems Neg Hx         Mardella Layman, MD 09/15/21 1044

## 2021-09-17 LAB — URINE CULTURE: Culture: 10000 — AB

## 2021-10-09 ENCOUNTER — Ambulatory Visit (HOSPITAL_COMMUNITY)
Admission: EM | Admit: 2021-10-09 | Discharge: 2021-10-09 | Disposition: A | Discharge status: Home / Self Care | Payer: Medicaid Other | Attending: Physician Assistant | Admitting: Physician Assistant

## 2021-10-09 DIAGNOSIS — N946 Dysmenorrhea, unspecified: Secondary | ICD-10-CM

## 2021-10-09 LAB — POCT URINALYSIS DIPSTICK, ED / UC
Bilirubin Urine: NEGATIVE
Glucose, UA: NEGATIVE mg/dL
Hgb urine dipstick: NEGATIVE
Ketones, ur: NEGATIVE mg/dL
Leukocytes,Ua: NEGATIVE
Nitrite: NEGATIVE
Protein, ur: NEGATIVE mg/dL
Specific Gravity, Urine: 1.02 (ref 1.005–1.030)
Urobilinogen, UA: 0.2 mg/dL (ref 0.0–1.0)
pH: 7 (ref 5.0–8.0)

## 2021-10-09 LAB — POC URINE PREG, ED: Preg Test, Ur: NEGATIVE

## 2021-10-09 NOTE — Discharge Instructions (Addendum)
I recommend alternating Tylenol and ibuprofen every 6 hours for menstrual cramping. Try hot pad or hot shower as well.  Please go to the emergency department if you have worsening symptoms.

## 2021-10-09 NOTE — ED Provider Notes (Addendum)
[facility]    CSN: [id number] Arrival date & time: 10/09/21  1312     History   Chief Complaint Chief Complaint  Patient presents with   Abdominal Pain    HPI Dawn [name] is a 21 y.o. female.   Presents with 4-day history of lower abdominal cramping that feels like her menstrual cycle.  Reports mild cramping pain in her lower right and left abdomen.  She usually gets these pains with her menstrual cycle but they are usually worse.  Reports hot shower has helped but has not tried any other medicines.  Last menstrual cycle ended on June 1 and she is just about due for her next one.  Reports they are very regular and this one seems to be 4 days late.  She denies any spotting, bleeding, passing clots, urinary symptoms, vaginal discharge, rash, fever, flank or back pain. Denies any nausea, vomiting, diarrhea or constipation.  She is currently sexually active but uses barrier methods every time and denies any exposure to STD.  She declines STD swab today.  Increased stress recently while looking for a job, also stressed about possible pregnancy. No recent increase in exercise. No hormonal birth control methods. No hx of ovarian cyst/torsion, endometritis, AUB.  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     Home Medications    Prior to Admission medications   Medication Sig Start Date End Date Taking? Authorizing Provider  cephALEXin (KEFLEX) 500 MG capsule Take 1 capsule (500 mg total) by mouth 2 (two) times daily. 09/15/21   Mardella Layman, MD  cetirizine (ZYRTEC ALLERGY) 10 MG tablet Take 1 tablet (10 mg total) by mouth daily. 11/20/20   Rhys Martini, PA-C  fluticasone (FLONASE) 50 MCG/ACT nasal spray Place 2 sprays into both nostrils daily. 11/20/20   Rhys Martini, PA-C    Family History Family History  Problem Relation Age of Onset   Hypertension Mother    Liver disease Father    Hyperlipidemia Father    Cancer Maternal Grandmother    Cancer Maternal Grandfather    Cirrhosis Paternal Grandfather    Gallbladder disease Sister    GI problems Neg Hx     Social History Social History   Tobacco Use   Smoking status: Never   Smokeless tobacco: Never  Vaping Use   Vaping Use: Never used  Substance Use Topics   Alcohol use: No   Drug use: No     Allergies   Salami [pickled meat] and Other   Review of Systems Review of Systems  Gastrointestinal:  Positive for abdominal pain.   Per HPI  Physical Exam  Triage Vital Signs ED Triage Vitals [10/09/21 1359]  Enc Vitals Group     BP 122/68     Pulse Rate (!) 115     Resp 18     Temp 99.5 F (37.5 C)     Temp Source Oral     SpO2 96 %     Weight      Height      Head Circumference      Peak Flow      Pain Score      Pain Loc      Pain Edu?      Excl. in GC?    No data found.  Updated Vital Signs BP 122/68 (BP Location: Left Arm)   Pulse (!) 115   Temp 99.5 F (37.5 C) (Oral)   Resp 18   LMP 09/04/2021 (Approximate)   SpO2 96%    Physical Exam Vitals and nursing note reviewed.  Constitutional:      General: She is not in acute distress.    Appearance: Normal appearance.  HENT:      Mouth/Throat:     Mouth: Mucous membranes are moist.     Pharynx: Oropharynx is clear.  Eyes:     Conjunctiva/sclera: Conjunctivae normal.  Cardiovascular:     Rate and Rhythm: Normal rate and regular rhythm.     Heart sounds: Normal heart sounds.  Pulmonary:     Effort: Pulmonary effort is normal. No respiratory distress.     Breath sounds: Normal breath sounds.  Abdominal:     General: Bowel sounds are normal. There is no distension.     Tenderness: There is abdominal tenderness. There is no right CVA tenderness, left CVA tenderness, guarding or rebound. Negative signs include Rovsing's sign and McBurney's sign.     Comments: Very mildly tender to palpation in RLQ and LLQ  Musculoskeletal:        General: Normal range of motion.  Skin:    General: Skin is warm and dry.  Neurological:     Mental Status: She is alert and oriented to person, place, and time.      UC Treatments / Results  Labs (all labs ordered are listed, but only abnormal results are displayed) Labs Reviewed  POC URINE PREG, ED  POCT URINALYSIS DIPSTICK, ED / UC    EKG  Radiology No results found.  Procedures Procedures (including critical care time)  Medications Ordered in UC Medications - No data to display  Initial Impression / Assessment and Plan / UC Course  I have reviewed the triage vital signs and the nursing notes.  Pertinent labs & imaging results that were available during my care of the patient were reviewed by me and considered in my medical decision making (see chart for details).  Exam is unremarkable and she has very mild abdominal pain. No concern for acute abdomen at this time. Urinalysis unremarkable, urine pregnancy was negative today. She declined any STD testing.  Most likely menstrual cramping.  Could be a little bit delayed due to her recent life stressors.  I recommend she keep an eye on her cramping and see if it changes or she develops spotting/bleeding indicating onset  of her period.  Any worsening symptoms she will go to the emergency department.  I recommend she try Tylenol versus ibuprofen and see if these improve her symptoms.  Hot pad or hot shower.  Contact information for [facility] given. Return precautions discussed. Patient agrees to plan and is  discharged in stable condition.  Final Clinical Impressions(s) / UC Diagnoses   Final diagnoses:  Menstrual cramps     Discharge Instructions      I recommend alternating Tylenol and ibuprofen every 6 hours for menstrual cramping. Try hot pad or hot shower as well.  Please go to the emergency department if you have worsening symptoms.    ED Prescriptions   None    PDMP not reviewed this encounter.   Demiya Magno, [place] 10/09/21 1430

## 2021-10-09 NOTE — ED Triage Notes (Signed)
Pt states lower abdominal pain x 4 days. She reports her period is 4 days late.

## 2022-07-31 ENCOUNTER — Ambulatory Visit (HOSPITAL_COMMUNITY)
Admission: EM | Admit: 2022-07-31 | Discharge: 2022-07-31 | Disposition: A | Discharge status: Home / Self Care | Payer: Medicaid Other | Attending: Internal Medicine | Admitting: Internal Medicine

## 2022-07-31 ENCOUNTER — Telehealth (HOSPITAL_COMMUNITY): Payer: Self-pay | Admitting: Internal Medicine

## 2022-07-31 ENCOUNTER — Encounter (HOSPITAL_COMMUNITY): Payer: Self-pay

## 2022-07-31 DIAGNOSIS — R002 Palpitations: Secondary | ICD-10-CM | POA: Insufficient documentation

## 2022-07-31 LAB — COMPREHENSIVE METABOLIC PANEL
ALT: 19 U/L (ref 0–44)
AST: 18 U/L (ref 15–41)
Albumin: 4.4 g/dL (ref 3.5–5.0)
Alkaline Phosphatase: 52 U/L (ref 38–126)
Anion gap: 7 (ref 5–15)
BUN: 7 mg/dL (ref 6–20)
CO2: 27 mmol/L (ref 22–32)
Calcium: 9.2 mg/dL (ref 8.9–10.3)
Chloride: 105 mmol/L (ref 98–111)
Creatinine, Ser: 0.62 mg/dL (ref 0.44–1.00)
GFR, Estimated: >60 mL/min (ref >60)
Glucose, Bld: 85 mg/dL (ref 70–99)
Potassium: 3.8 mmol/L (ref 3.5–5.1)
Sodium: 139 mmol/L (ref 135–145)
Total Bilirubin: 0.7 mg/dL (ref 0.3–1.2)
Total Protein: 7.2 g/dL (ref 6.5–8.1)

## 2022-07-31 LAB — TSH: TSH: 1.513 u[IU]/mL (ref 0.350–4.500)

## 2022-07-31 NOTE — ED Provider Notes (Signed)
[facility]    CSN: [id number] Arrival date & time: 07/31/22  4098      History   Chief Complaint Chief Complaint  Patient presents with   Hypertension   Hyperglycemia    HPI Dawn [name] is a 22 y.o. female comes to urgent care with complaints of palpitations which happened 4 days ago.  Patient describes sudden onset of palpitations.  It was associated with some blurry vision but she denies any chest pain or chest pressure.  He was at work when this happened.  She checked her heart rate and her heart rate was 146 with a blood pressure of 132/90.  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  No dizziness or cold sweats.  This is the first of such episodes that the patient has experienced.  Patient does not drink caffeine or use any drugs.  She does not use energy drinks.  No fever or chills.  No nausea, vomiting or diarrhea.  Her menstrual periods are regular.  Patient denies any cold or hot intolerance.  She endorses unintentional weight loss but denies any night sweats.  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      Home Medications    Prior to Admission medications   Medication Sig Start  Date End Date Taking? Authorizing Provider  cephALEXin (KEFLEX) 500 MG capsule Take 1 capsule (500 mg total) by mouth 2 (two) times daily. 09/15/21   Mardella Layman, MD  cetirizine (ZYRTEC ALLERGY) 10 MG tablet Take 1 tablet (10 mg total) by mouth daily. 11/20/20   Rhys Martini, PA-C  fluticasone (FLONASE) 50 MCG/ACT nasal spray Place 2 sprays into both nostrils daily. 11/20/20   Rhys Martini, PA-C    Family History Family History  Problem Relation Age of Onset   Hypertension Mother    Liver disease Father    Hyperlipidemia Father    Cancer Maternal Grandmother    Cancer Maternal Grandfather    Cirrhosis Paternal Grandfather    Gallbladder disease Sister    GI problems Neg Hx     Social History Social History   Tobacco Use   Smoking status: Never   Smokeless tobacco: Never  Vaping Use   Vaping Use: Never used  Substance Use Topics   Alcohol use: No   Drug use: No     Allergies   Salami [pickled meat] and Other   Review of Systems Review of Systems As per HPI  Physical Exam Triage Vital Signs ED Triage Vitals  Enc Vitals Group     BP 07/31/22 1020 101/70     Pulse Rate 07/31/22 1020 80  Resp 07/31/22 1020 14     Temp 07/31/22 1020 98.3 F (36.8 C)     Temp Source 07/31/22 1020 Oral     SpO2 07/31/22 1020 98 %     Weight --      Height --      Head Circumference --      Peak Flow --      Pain Score 07/31/22 1021 0     Pain Loc --      Pain Edu? --      Excl. in GC? --    No data found.  Updated Vital Signs BP 101/70 (BP Location: Right Arm)   Pulse 80   Temp 98.3 F (36.8 C) (Oral)   Resp 14   LMP 07/04/2022 (Approximate)   SpO2 98%   Visual Acuity Right Eye Distance:   Left Eye Distance:   Bilateral Distance:    Right Eye Near:   Left Eye Near:    Bilateral Near:     Physical Exam Vitals and nursing note reviewed.  HENT:     Right Ear: Tympanic membrane normal.     Left Ear: Tympanic membrane normal.     Nose: Nose normal.      Mouth/Throat:     Mouth: Mucous membranes are dry.     Pharynx: Oropharynx is clear.  Cardiovascular:     Rate and Rhythm: Normal rate and regular rhythm.  Pulmonary:     Effort: Pulmonary effort is normal.     Breath sounds: Normal breath sounds.  Abdominal:     General: Bowel sounds are normal.     Palpations: Abdomen is soft.  Neurological:     Mental Status: She is alert.      UC Treatments / Results  Labs (all labs ordered are listed, but only abnormal results are displayed) Labs Reviewed  CBC WITH DIFFERENTIAL/PLATELET  COMPREHENSIVE METABOLIC PANEL  TSH    EKG   Radiology No results found.  Procedures Procedures (including critical care time)  Medications Ordered in UC Medications - No data to display  Initial Impression / Assessment and Plan / UC Course  I have reviewed the triage vital signs and the nursing notes.  Pertinent labs & imaging results that were available during my care of the patient were reviewed by me and considered in my medical decision making (see chart for details).     1.  Palpitations: BC, CMP and TSH EKG shows a normal sinus rhythm Patient will need Holter monitor I discussed the case with the cardiology team and they will arrange for a Holter monitor  Final Clinical Impressions(s) / UC Diagnoses   Final diagnoses:  None     Discharge Instructions      Please maintain adequate hydration Will call you with recommendations if labs are abnormal The cardiology team will reach out to you to set the Holter monitor up Return to urgent care if you have further symptoms.     ED Prescriptions   None    PDMP not reviewed this encounter.   Merrilee Jansky, MD 07/31/22 [phone]

## 2022-07-31 NOTE — ED Triage Notes (Addendum)
Patient reports that she felt like her heart was racing and checked her BP and HR  4 days ago and it was HR-146 and BP-132/90  and states last night her CBG was 130. Patient states she is not diabetic but was worried because people in her family have diabetes.  Patient c/o feeling tired and nauseated.

## 2022-07-31 NOTE — Telephone Encounter (Signed)
Lab recollect needed.

## 2022-07-31 NOTE — Discharge Instructions (Addendum)
Please maintain adequate hydration Will call you with recommendations if labs are abnormal The cardiology team will reach out to you to set the Holter monitor up Return to urgent care if you have further symptoms.

## 2022-08-01 ENCOUNTER — Ambulatory Visit (HOSPITAL_COMMUNITY)
Admission: EM | Admit: 2022-08-01 | Discharge: 2022-08-01 | Disposition: A | Discharge status: Home / Self Care | Payer: Medicaid Other | Attending: Internal Medicine | Admitting: Internal Medicine

## 2022-08-01 DIAGNOSIS — R002 Palpitations: Secondary | ICD-10-CM | POA: Diagnosis not present

## 2022-08-01 LAB — CBC
HCT: 43.9 % (ref 36.0–46.0)
Hemoglobin: 14.3 g/dL (ref 12.0–15.0)
MCH: 30.4 pg (ref 26.0–34.0)
MCHC: 32.6 g/dL (ref 30.0–36.0)
MCV: 93.2 fL (ref 80.0–100.0)
Platelets: 288 10*3/uL (ref 150–400)
RBC: 4.71 MIL/uL (ref 3.87–5.11)
RDW: 11.9 % (ref 11.5–15.5)
WBC: 8.4 10*3/uL (ref 4.0–10.5)
nRBC: 0 % (ref 0.0–0.2)

## 2022-08-01 NOTE — ED Notes (Signed)
Pt here for recollect of CBC due to lab error.

## 2022-08-09 ENCOUNTER — Encounter: Payer: Self-pay | Admitting: Family Medicine

## 2022-08-09 ENCOUNTER — Ambulatory Visit (INDEPENDENT_AMBULATORY_CARE_PROVIDER_SITE_OTHER): Payer: Medicaid Other | Admitting: Family Medicine

## 2022-08-09 VITALS — BP 94/60 | HR 85 | Temp 98.5°F | Resp 18 | Ht 62.5 in | Wt 158.6 lb

## 2022-08-09 DIAGNOSIS — Z0000 Encounter for general adult medical examination without abnormal findings: Secondary | ICD-10-CM

## 2022-08-09 DIAGNOSIS — Z13220 Encounter for screening for lipoid disorders: Secondary | ICD-10-CM

## 2022-08-09 DIAGNOSIS — Z1159 Encounter for screening for other viral diseases: Secondary | ICD-10-CM

## 2022-08-09 DIAGNOSIS — Z87898 Personal history of other specified conditions: Secondary | ICD-10-CM

## 2022-08-09 NOTE — Progress Notes (Signed)
Complete physical exam  Patient: Dawn [name]   DOB: 2000/06/09   21 y.o. Female  MRN: [id number]  Subjective:    Chief Complaint  Patient presents with   Establish Care    Patient is here for annual physical and she states that her only concern at this time is that about 2-3 weeks ago while she was at work, she began to feel her heart start to beat fast, as well as felt tired and weak, She had her BP taken at work and it was 130/92, Later she had it taken around 10 and it dropped 86/47, She states that she is very concerned with what is going on     Dawn [name] is a 22 y.o. female who presents today for a complete physical exam. She reports consuming a general diet. The patient does not participate in regular exercise at present. She generally feels well. She reports sleeping fairly well. She does have additional problems to discuss today. She presents today to establish care with this practice. She is new to me. 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   Had one episode of heart palpitations on 4/23 went to urgent care. Normal EKG, labs were normal. CBC, CMP, TSH. Provider recommended Heart monitor which she has not heard from cardiology. Will place referral today. Reports this has not happened since 4/23. Heart rate normal today. Blood pressure stable.  Most recent fall risk assessment:    08/09/2022    3:04 PM  Fall Risk   Falls in the past year? 0  Number falls in past yr: 0  Injury with Fall? 0  Risk for fall due to : History of fall(s)  Follow up Falls evaluation completed     Most recent depression screenings:    08/09/2022    3:04 PM 10/16/2019    9:30 AM  PHQ 2/9 Scores  PHQ - 2 Score 0 0  PHQ- 9 Score 3   Denies thoughts of self harm.   Vision:Not within last year  and Dental: No current dental problems and Receives regular dental care    Patient Care Team: Theadore Nan, MD as PCP - General (Pediatrics) Elveria Rising,  NP as Nurse Practitioner (Neurology) Salem Senate, MD as Consulting Physician (Pediatric Gastroenterology)   Outpatient Medications Prior to Visit  Medication Sig   cetirizine (ZYRTEC ALLERGY) 10 MG tablet Take 1 tablet (10 mg total) by mouth daily. (Patient not taking: Reported on 08/09/2022)   fluticasone (FLONASE) 50 MCG/ACT nasal spray Place 2 sprays into both nostrils daily. (Patient not taking: Reported on 08/09/2022)   [DISCONTINUED] cephALEXin (KEFLEX) 500 MG capsule Take 1 capsule (500 mg total) by mouth 2 (two) times daily.   No facility-administered medications prior to visit.    Review of Systems  Constitutional:  Positive for malaise/fatigue (feels tired occassionally, works nights). Negative for chills, fever and weight loss.  Eyes:  Negative for blurred vision and double vision.  Respiratory:  Negative for shortness of breath.   Cardiovascular:  Negative for chest pain.  Gastrointestinal:  Negative for abdominal pain, nausea and vomiting.  Neurological:  Negative for dizziness and headaches.  Psychiatric/Behavioral:  Negative for depression and suicidal ideas. The patient is not nervous/anxious.           Objective:     BP 94/60   Pulse 85   Temp 98.5 F (36.9 C) (Oral)   Resp 18   Ht 5' 2.5" (1.588  m)   Wt 158 lb 10.1 oz (72 kg)   LMP 07/16/2022   SpO2 97%   BMI 28.55 kg/m  BP Readings from Last 3 Encounters:  08/09/22 94/60  07/31/22 101/70  10/09/21 122/68      Physical Exam Vitals and nursing note reviewed.  Constitutional:      General: She is not in acute distress.    Appearance: Normal appearance.  HENT:     Right Ear: Tympanic membrane normal.     Left Ear: Tympanic membrane normal.     Nose: Nose normal.     Mouth/Throat:     Mouth: Mucous membranes are moist.     Pharynx: Oropharynx is clear. No oropharyngeal exudate or posterior oropharyngeal erythema.  Eyes:     Extraocular Movements: Extraocular movements intact.   Neck:     Thyroid: No thyroid tenderness.  Cardiovascular:     Rate and Rhythm: Normal rate and regular rhythm.     Pulses: Normal pulses.     Heart sounds: Normal heart sounds.  Pulmonary:     Effort: Pulmonary effort is normal.     Breath sounds: Normal breath sounds.  Abdominal:     General: Bowel sounds are normal.     Palpations: Abdomen is soft.     Tenderness: There is no abdominal tenderness.  Musculoskeletal:        General: Normal range of motion.     Cervical back: Normal range of motion.     Right lower leg: No edema.     Left lower leg: No edema.  Lymphadenopathy:     Cervical:     Right cervical: No superficial cervical adenopathy.    Left cervical: No superficial cervical adenopathy.  Skin:    General: Skin is warm and dry.     Capillary Refill: Capillary refill takes less than 2 seconds.  Neurological:     General: No focal deficit present.     Mental Status: She is alert. Mental status is at baseline.  Psychiatric:        Mood and Affect: Mood normal.        Behavior: Behavior normal.        Thought Content: Thought content normal.        Judgment: Judgment normal.      No results found for any visits on 08/09/22. Last CBC Lab Results  Component Value Date   WBC 8.4 08/01/2022   HGB 14.3 08/01/2022   HCT 43.9 08/01/2022   MCV 93.2 08/01/2022   MCH 30.4 08/01/2022   RDW 11.9 08/01/2022   PLT 288 08/01/2022   Last metabolic panel Lab Results  Component Value Date   GLUCOSE 85 07/31/2022   NA 139 07/31/2022   K 3.8 07/31/2022   CL 105 07/31/2022   CO2 27 07/31/2022   BUN 7 07/31/2022   CREATININE 0.62 07/31/2022   GFRNONAA >60 07/31/2022   CALCIUM 9.2 07/31/2022   PROT 7.2 07/31/2022   ALBUMIN 4.4 07/31/2022   BILITOT 0.7 07/31/2022   ALKPHOS 52 07/31/2022   AST 18 07/31/2022   ALT 19 07/31/2022   ANIONGAP 7 07/31/2022   Last thyroid functions Lab Results  Component Value Date   TSH 1.513 07/31/2022        Assessment & Plan:     Routine Health Maintenance and Physical Exam  Immunization History  Administered Date(s) Administered   DTaP 04/24/2002, 06/26/2002, 08/27/2002, 07/16/2003, 07/06/2006   HIB (PRP-OMP) 04/24/2002, 06/26/2002, 08/27/2002, 07/16/2003   HPV  9-valent 02/04/2014   HPV Quadrivalent 08/05/2013, 09/19/2013   Hepatitis A 06/02/2005, 01/31/2006   Hepatitis B 02/27/2002, 04/24/2002, 12/03/2002   IPV 04/24/2002, 06/26/2002, 03/18/2003, 07/06/2006   Influenza Nasal 06/02/2005   Influenza,inj,Quad PF,6+ Mos 06/19/2015, 01/24/2016, 02/15/2017, 12/25/2017, 02/04/2019, 03/16/2021   Influenza,inj,quad, With Preservative 01/01/2014   Influenza-Unspecified 02/11/2003, 03/18/2003, 02/15/2004, 06/02/2005, 01/31/2006, 03/28/2007, 03/16/2008, 04/14/2009, 01/06/2010, 03/25/2011   MMR 03/18/2003, 07/06/2006   Meningococcal Conjugate 08/05/2013, 03/15/2018   PFIZER(Purple Top)SARS-COV-2 Vaccination 09/29/2019, 10/20/2019   Pneumococcal Conjugate-13 12/03/2002, 07/16/2003   Pneumococcal-Unspecified 04/24/2002, 06/26/2002   Tdap 08/05/2013   Varicella 03/18/2003, 07/06/2006    Health Maintenance  Topic Date Due   Hepatitis C Screening  Never done   CHLAMYDIA SCREENING  11/20/2021   COVID-19 Vaccine (3 - 2023-24 season) 12/09/2021   INFLUENZA VACCINE  11/09/2022   DTaP/Tdap/Td (7 - Td or Tdap) 08/06/2023   HPV VACCINES  Completed   HIV Screening  Completed    Discussed health benefits of physical activity, and encouraged her to engage in regular exercise appropriate for her age and condition.  Annual physical exam -     Hepatitis C antibody -     HIV Antibody (routine testing w rflx) -     Lipid panel  History of palpitations -     Ambulatory referral to Cardiology  Encounter for lipid screening for cardiovascular disease -     Lipid panel  Need for hepatitis C screening test -     Hepatitis C antibody  Screening for viral disease -     HIV Antibody (routine testing w rflx)   Routine  labs ordered, CBC, CMP, TSH done at urgent care visit.  HCM reviewed/discussed. Agrees to getting Hep C and HIV, declines additional STD testing.  Will get pap in November when she turns 22 years. Anticipatory guidance regarding healthy weight, lifestyle and choices given. Recommend healthy diet.  Recommend approximately 150 minutes/week of moderate intensity exercise. Resistance training is good for building muscles and for bone health. Muscle mass helps to increase our metabolism and to burn more calories at rest.  Limit alcohol consumption: no more than one drink per day for women and 2 drinks per day for me. Recommend regular dental and vision exams. Always use seatbelt/lap and shoulder restraints. Recommend using smoke alarms and checking batteries at least twice a year. Recommend using sunscreen when outside.  Please know that I am here to help you with all of your health care goals and am happy to work with you to find a solution that works best for you.  The greatest advice I have received with any changes in life are to take it one step at a time, that even means if all you can focus on is the next 60 seconds, then do that and celebrate your victories.  With any changes in life, you will have set backs, and that is OK. The important thing to remember is, if you have a set back, it is not a failure, it is an opportunity to try again! Agrees with plan of care discussed.  Questions answered.    Return in about 7 months (around 03/12/2023) for pap only .     Novella Olive, FNP

## 2022-08-10 DIAGNOSIS — Z1159 Encounter for screening for other viral diseases: Secondary | ICD-10-CM | POA: Diagnosis not present

## 2022-08-10 DIAGNOSIS — Z136 Encounter for screening for cardiovascular disorders: Secondary | ICD-10-CM | POA: Diagnosis not present

## 2022-08-10 DIAGNOSIS — Z13220 Encounter for screening for lipoid disorders: Secondary | ICD-10-CM | POA: Diagnosis not present

## 2022-08-11 LAB — LIPID PANEL
Chol/HDL Ratio: 2.9 ratio (ref 0.0–4.4)
Cholesterol, Total: 127 mg/dL (ref 100–199)
HDL: 44 mg/dL (ref >39)
LDL Chol Calc (NIH): 70 mg/dL (ref 0–99)
Triglycerides: 62 mg/dL (ref 0–149)
VLDL Cholesterol Cal: 13 mg/dL (ref 5–40)

## 2022-08-11 LAB — HEPATITIS C ANTIBODY: Hep C Virus Ab: NONREACTIVE

## 2022-08-11 LAB — HIV ANTIBODY (ROUTINE TESTING W REFLEX): HIV Screen 4th Generation wRfx: NONREACTIVE

## 2022-09-18 ENCOUNTER — Encounter: Payer: Self-pay | Admitting: Family Medicine

## 2022-09-18 ENCOUNTER — Ambulatory Visit (INDEPENDENT_AMBULATORY_CARE_PROVIDER_SITE_OTHER): Payer: Medicaid Other | Admitting: Family Medicine

## 2022-09-18 VITALS — BP 109/73 | HR 65 | Temp 99.0°F | Ht 62.5 in | Wt 159.3 lb

## 2022-09-18 DIAGNOSIS — Z30019 Encounter for initial prescription of contraceptives, unspecified: Secondary | ICD-10-CM

## 2022-09-18 LAB — POCT URINE PREGNANCY: Preg Test, Ur: NEGATIVE

## 2022-09-18 MED ORDER — NORGESTIMATE-ETH ESTRADIOL 0.25-35 MG-MCG PO TABS: 1.0000 | ORAL_TABLET | Freq: Every day | ORAL | 11 refills | Status: DC

## 2022-09-18 NOTE — Progress Notes (Signed)
Established Patient Office Visit  Subjective   Patient ID: Dawn [name], female    DOB: Jun 09, 2000  Age: 22 y.o. MRN: [id number]  Chief Complaint  Patient presents with   Contraception    Late on cycle. Was supposed to start on June 4th, but started on June 7th. She took Plan B on June 9th.  Pt denies nausea or vomiting. Waiting on UPT results.      HPI Presents today to start on oral contraceptives for birth control. LMP 6/7- still having light bleeding today. Took plan B on Sunday 6/2 due to risk of pregnancy. Has taken OCP in past, tolerated well. Reports history of migraine without aura. Gets headache about twice per year. Not on treatment for migraine. Non-smoker, no history of DVT.   Unprotected intercourse 6/2, pregnancy test negative today in office.     Smoking status: never   HTN: no  Diabetes: no   Migraine with aura: denies  History of breast cancer: no   VTE status/history: no   Postpartum status: n/a     Review of Systems  Eyes:  Negative for blurred vision and double vision.  Respiratory:  Negative for shortness of breath.   Cardiovascular:  Negative for chest pain and palpitations.  Neurological:  Negative for dizziness and headaches.      Objective:     BP 109/73   Pulse 65   Temp 99 F (37.2 C) (Oral)   Ht 5' 2.5" (1.588 m)   Wt 159 lb 4.8 oz (72.3 kg)   SpO2 98%   BMI 28.67 kg/m  BP Readings from Last 3 Encounters:  09/18/22 109/73  08/09/22 94/60  07/31/22 101/70      Physical Exam Vitals and nursing note reviewed.  Pulmonary:     Effort: Pulmonary effort is normal.  Skin:    General: Skin is warm and dry.  Neurological:     General: No focal deficit present.     Mental Status: Mental status is at baseline.  Psychiatric:        Mood and Affect: Mood normal.        Behavior: Behavior normal.        Thought Content: Thought content normal.        Judgment: Judgment normal.     Results for orders placed or  performed in visit on 09/18/22  POCT urine pregnancy  Result Value Ref Range   Preg Test, Ur Negative Negative    Last metabolic panel Lab Results  Component Value Date   GLUCOSE 85 07/31/2022   NA 139 07/31/2022   K 3.8 07/31/2022   CL 105 07/31/2022   CO2 27 07/31/2022   BUN 7 07/31/2022   CREATININE 0.62 07/31/2022   GFRNONAA >60 07/31/2022   CALCIUM 9.2 07/31/2022   PROT 7.2 07/31/2022   ALBUMIN 4.4 07/31/2022   BILITOT 0.7 07/31/2022   ALKPHOS 52 07/31/2022   AST 18 07/31/2022   ALT 19 07/31/2022   ANIONGAP 7 07/31/2022   Last lipids Lab Results  Component Value Date   CHOL 127 08/10/2022   HDL 44 08/10/2022   LDLCALC 70 08/10/2022   TRIG 62 08/10/2022   CHOLHDL 2.9 08/10/2022      The ASCVD Risk score (Arnett DK, et al., 2019) failed to calculate for the following reasons:   The 2019 ASCVD risk score is only valid for ages 84 to 56    Assessment & Plan:   Problem List Items Addressed This Visit  Encounter for female birth control - Primary   Relevant Medications   norgestimate-ethinyl estradiol (SPRINTEC 28) 0.25-35 MG-MCG tablet  Possible side effects reviewed. She will notify provider with any concerns.    Other Relevant Orders   POCT urine pregnancy  Negative in office.   Reviewed when to start OCP. Will start today and use back up method of birth control for 7  days.  Urine preg negative in office today.  Agrees with plan of care discussed.  Questions answered.   Return if symptoms worsen or fail to improve.    Novella Olive, FNP

## 2022-09-21 ENCOUNTER — Ambulatory Visit: Payer: Medicaid Other | Admitting: Family Medicine

## 2022-10-04 ENCOUNTER — Encounter: Payer: Self-pay | Admitting: Family Medicine

## 2022-10-04 ENCOUNTER — Ambulatory Visit (INDEPENDENT_AMBULATORY_CARE_PROVIDER_SITE_OTHER): Payer: Medicaid Other | Admitting: Family Medicine

## 2022-10-04 VITALS — BP 103/63 | HR 116 | Ht 63.0 in | Wt 160.0 lb

## 2022-10-04 DIAGNOSIS — Z7251 High risk heterosexual behavior: Secondary | ICD-10-CM | POA: Diagnosis not present

## 2022-10-04 DIAGNOSIS — R3915 Urgency of urination: Secondary | ICD-10-CM | POA: Diagnosis not present

## 2022-10-04 DIAGNOSIS — N898 Other specified noninflammatory disorders of vagina: Secondary | ICD-10-CM | POA: Insufficient documentation

## 2022-10-04 DIAGNOSIS — R1084 Generalized abdominal pain: Secondary | ICD-10-CM | POA: Diagnosis not present

## 2022-10-04 LAB — POCT URINE PREGNANCY: Preg Test, Ur: NEGATIVE

## 2022-10-04 NOTE — Progress Notes (Signed)
Established Patient Office Visit  Subjective   Patient ID: Dawn [name], female    DOB: 09-25-2000  Age: 22 y.o. MRN: [id number]  Chief Complaint  Patient presents with   Urinary Tract Infection    HPI  Presents today for an acute visit with complaint of having sexual intercourse on Sunday, started to have discharge the next day. Tuesday she wiped and saw hint of yellow. Ovaries have been hurting one side then the other side. Having urinary urgency. Did not use condom with intercourse on Sunday. Vaginal discharge resembling white tissue.  Symptoms have been present  since Sunday.  Associated symptoms include: left quadrant pain. Feels like "ovulating"  Pertinent negatives: no fever.  Pain severity: 3/10 intermittent pain Treatments tried include : nothing Treatment effective : n/a Tolerating OCPs that were started on 09/18/22.  Urine pregnancy is negative in office today.    Review of Systems  Constitutional:  Negative for fever.  Gastrointestinal:  Positive for abdominal pain. Negative for constipation, diarrhea, nausea and vomiting.       LBM 10/04/22   Genitourinary:  Positive for urgency. Negative for dysuria, flank pain, frequency and hematuria.      Objective:     BP 103/63   Pulse (!) 116   Ht 5\' 3"  (1.6 m)   Wt 160 lb (72.6 kg)   SpO2 99%   BMI 28.34 kg/m  BP Readings from Last 3 Encounters:  10/04/22 103/63  09/18/22 109/73  08/09/22 94/60      Physical Exam Vitals and nursing note reviewed.  Constitutional:      General: She is not in acute distress.    Appearance: Normal appearance.  Cardiovascular:     Rate and Rhythm: Regular rhythm.     Heart sounds: Normal heart sounds.  Pulmonary:     Effort: Pulmonary effort is normal.     Breath sounds: Normal breath sounds.  Abdominal:     General: Bowel sounds are normal.     Palpations: Abdomen is soft.     Tenderness: There is generalized abdominal tenderness.  Genitourinary:    Comments:  Deferred, will do self swab Skin:    General: Skin is warm and dry.     Capillary Refill: Capillary refill takes less than 2 seconds.  Neurological:     General: No focal deficit present.     Mental Status: She is alert. Mental status is at baseline.  Psychiatric:        Mood and Affect: Mood normal.        Behavior: Behavior normal.        Thought Content: Thought content normal.        Judgment: Judgment normal.      Results for orders placed or performed in visit on 10/04/22  POCT urine pregnancy  Result Value Ref Range   Preg Test, Ur Negative Negative      The ASCVD Risk score (Arnett DK, et al., 2019) failed to calculate for the following reasons:   The 2019 ASCVD risk score is only valid for ages 74 to 22    Assessment & Plan:   Problem List Items Addressed This Visit     Generalized abdominal pain   Relevant Orders   US PELVIC COMPLETE WITH TRANSVAGINAL   Vaginal discharge - Primary   Relevant Orders   BV+Ct/Ng/Tv NAA+Yeast Culture   US PELVIC COMPLETE WITH TRANSVAGINAL   Urinary urgency   Relevant Orders   POCT urine pregnancy (Completed)  Unprotected sexual intercourse   Relevant Orders   BV+Ct/Ng/Tv NAA+Yeast Culture   POCT urine pregnancy (Completed)  Unprotected sexual intercourse on Sunday. On OCP's in 09/18/22. POC urine pregnancy negative today in office.  Vaginal symptoms and abdominal pain started on Monday. No fever, nausea or vomiting. Chronic constipation, had normal BM today.  Wants STD testing. She will self swab.  Will send for abdominal ultrasound with transvaginal to rule out pathology.  Agrees with plan of care discussed.  Questions answered.   Return if symptoms worsen or fail to improve, for to be determined based on results .    Novella Olive, FNP

## 2022-10-05 ENCOUNTER — Encounter (HOSPITAL_BASED_OUTPATIENT_CLINIC_OR_DEPARTMENT_OTHER): Payer: Self-pay | Admitting: Cardiology

## 2022-10-05 ENCOUNTER — Encounter: Payer: Self-pay | Admitting: Family Medicine

## 2022-10-05 ENCOUNTER — Ambulatory Visit (INDEPENDENT_AMBULATORY_CARE_PROVIDER_SITE_OTHER): Payer: Medicaid Other | Admitting: Cardiology

## 2022-10-05 VITALS — BP 96/72 | HR 74 | Ht 63.0 in | Wt 161.0 lb

## 2022-10-05 DIAGNOSIS — Z7189 Other specified counseling: Secondary | ICD-10-CM | POA: Diagnosis not present

## 2022-10-05 DIAGNOSIS — R031 Nonspecific low blood-pressure reading: Secondary | ICD-10-CM | POA: Diagnosis not present

## 2022-10-05 DIAGNOSIS — R0789 Other chest pain: Secondary | ICD-10-CM | POA: Diagnosis not present

## 2022-10-05 DIAGNOSIS — R002 Palpitations: Secondary | ICD-10-CM | POA: Diagnosis not present

## 2022-10-05 NOTE — Patient Instructions (Addendum)
Medication Instructions:  Continue current medications  *If you need a refill on your cardiac medications before your next appointment, please call your pharmacy*   Lab Work: None Ordered   Testing/Procedures: None Ordered   Follow-Up: At [facility], you and your health needs are our priority.  As part of our continuing mission to provide you with exceptional heart care, we have created designated Provider Care Teams.  These Care Teams include your primary Cardiologist (physician) and Advanced Practice Providers (APPs -  Physician Assistants and Nurse Practitioners) who all work together to provide you with the care you need, when you need it.  We recommend signing up for the patient portal called "MyChart".  Sign up information is provided on this After Visit Summary.  MyChart is used to connect with patients for Virtual Visits (Telemedicine).  Patients are able to view lab/test results, encounter notes, upcoming appointments, etc.  Non-urgent messages can be sent to your provider as well.   To learn more about what you can do with MyChart, go to [email or web address].    Your next appointment:   As Needed  Vagal maneuvers are other ways to try to manage SVT. Here is some information for the [facility]: What are vagal maneuvers? Vagal maneuvers are physical actions that make your vagus nerve act on your heart's natural pacemaker, slowing down its electrical impulses. Your vagus nerve -- which goes from your brainstem to your belly -- plays a major role in your parasympathetic nervous system, which controls a number of things in your body, including heart rate.  Healthcare providers can do vagal maneuvers when it makes sense for a person with a fast heart rate. Don't try these yourself without talking to your healthcare provider first.  Types of vagal maneuvers Healthcare providers often use these:  Valsalva maneuver (bearing down like you're having a bowel movement  (pooping). See below). Diving reflex. Carotid sinus massage. Gag reflex. Coughing. Handstand for 30 seconds. (In one study, healthcare providers taught parents how to help their kids do this.) Applied abdominal pressure. (Try lying on your back and folding your lower body toward your face until your feet are past your head. Take a breath and strain for 20 to 30 seconds.) Why are vagal maneuvers used? Vagal maneuvers are a first-line (first choice) treatment for supraventricular tachycardia (SVT) (fast heart rate) because they're a low-risk, low-cost way to slow down a heart rate that's too fast. They can have a 20% to 40% success rate for getting certain fast heart rhythms (more than 100 beats a minute) back to normal rhythms.  Vagal maneuvers can also help your healthcare provider diagnose which type of arrhythmia (irregular or abnormal beat) you have, as certain types of heart rhythm disorders classically respond to this maneuver.  Who shouldn't have vagal maneuvers? Your healthcare provider will only use vagal maneuvers if you're considered stable. They won't do vagal maneuvers if you're unstable, meaning you have:  Low blood pressure. Chest pain. Shortness of breath. A shortage of oxygen in your body. An inability to get enough blood to your organs. If you're unstable, your healthcare provider will do cardioversion (using medicine or an electrical shock) instead of vagal maneuvers. Anyone who's feeling unwell should go to an emergency room or call 911 immediately.  How commonly are vagal maneuvers used? Supraventricular tachycardia (SVT) is common in adults and children, and is the most common heart rhythm abnormality in children. An estimated 1 in 250 to 1 in 1,000 children have SVT.  Since vagal maneuvers are the first treatment choice for SVT, they're commonly used.  Procedure Details What happens before vagal maneuvers? Your healthcare provider will do an electrocardiogram (EKG) to  check your heart rhythm. They'll monitor your heart rate, blood pressure and oxygen level.  What happens during vagal maneuvers? Here's how healthcare providers do the three most common vagal maneuvers:  Diving reflex While sitting, you'll take several deep breaths, hold your breath and then quickly put your whole face into a container of ice water. Keep your face submerged as long as you can.  The alternative approach is putting a bag of ice water or an ice-cold, wet towel against your face.  Valsalva maneuver While lying on your back, take a deep breath and act like you're exhaling but with your nose and mouth closed for 10 to 30 seconds. It should feel like trying to breathe air out into a blocked straw.  In a modified version of this maneuver (which can work better than the original method), you can do this while sitting up and then have your healthcare provider quickly drop the part of the bed supporting your upper body.  When they lower your bed, they bring your knees to your chest or put your legs in the air. Keep your legs in that position 30 to 45 seconds longer than holding your breath.  Another Valsalva technique healthcare providers use for kids is to have them blow on their thumb without letting any air out.  Carotid sinus massage You'll lie on your back with your head turned to one side. Your healthcare provider will use their fingers to push on your carotid sinus for five to 10 seconds. If it doesn't work, they can try again after a minute or try the other side of your neck.  What happens after vagal maneuvers? Hopefully, the arrhythmia (irregular or abnormal beat) resolves. Your healthcare provider will do another electrocardiogram (EKG) to see if the vagal maneuver was successful at bringing your heart rhythm back to normal. If they try vagal maneuvers two or three times and they don't work, they can give you medication to treat your arrhythmia. Medical or electrical  cardioversion is another treatment option.  If vagal maneuvers don't work, your healthcare provider may contact a cardiologist (heart specialist) to evaluate you.

## 2022-10-05 NOTE — Progress Notes (Signed)
Cardiology Office Note:  .    Date:  10/05/2022  ID:  Dawayne Cirri, DOB [birth year], MRN [id number] PCP: Novella Olive, FNP  [facility] Providers Cardiologist:  Jodelle Red, MD     History of Present Illness: .    Dawn [name] is a 22 y.o. female with a hx of palpitations, GERD, migraines, here for the evaluation of palpitations. She was seen by her PCP 08/09/2022 after presenting to urgent care 4/23 for palpitations. A heart monitor had been recommended so she was referred to cardiology.  Tachycardia/palpitations: -Initial onset: Occurred "out of nowhere" while in clinic; she works as a Lawyer. She started feeling her heart beating faster and faster with developing a headache. At the time of seeing her second patient heart rate was up to 150 bpm, BP 130/86. She states that the charge nurse didn't auscultate any PVCs or PACs at the time. Later her BP dropped to 86/47, then went back up. For the next few days she felt very fatigued.  -Frequency/Duration: Once in a while does notice her heart rate going quickly. At one time during a code in clinic, felt herself shaking. Typically her heart rate is normal while walking from seeing 2-3 patients, then may increase to 125 bpm despite feeling like she doesn't do much besides walking.  -Associated symptoms: She wasn't sure if she was experiencing anxiety; usually she feels more relaxed at work. Lately, she has been feeling colder than usual, or sometimes more hot than usual. Additionally, she complains of a little pain sometimes in her right upper chest, always localized to the same area. With palpation to the region it "just hurts." -Aggravating/alleviating factors:  Heart rate increases noticeably with walking. -Syncope/near syncope:  None -Prior cardiac history:  None -Prior workup:  Usually she wears her Apple Watch at work, does have EKG functionality. -Prior treatment:  None -Caffeine:  Typically drinks 1-2 sodas a day.   -Comorbidities:  Generally, her blood pressure is stable averaging 110/80; more recently it has trended lower.  -Exercise level:  She works as a Lawyer at [employer]. Admits to not being very active otherwise, not much formal exercise. -Labs: TSH, kidney function/electrolytes, CBC reviewed. -Cardiac ROS: no shortness of breath, no PND, no orthopnea, no LE edema. -Family history:  On her maternal side, she has an aunt who died of a heart attack in her 79's. Her niece was born with a septal defect.  ROS:  Please see the history of present illness. ROS otherwise negative except as noted.  (+) Palpitations (+) Right chest pain  Studies Reviewed: Marland Kitchen    EKG Interpretation Date/Time:  Thursday October 05 2022 15:49:29 EDT Ventricular Rate:  74 PR Interval:  148 QRS Duration:  76 QT Interval:  340 QTC Calculation: 377 R Axis:   57  Text Interpretation: Normal sinus rhythm Normal ECG When compared with ECG of 31-Jul-2022 11:10, No significant change was found Confirmed by Jodelle Red [phone]) on 10/05/2022 4:08:02 PM       Physical Exam:    VS:  BP 96/72   Pulse 74   Ht 5\' 3"  (1.6 m)   Wt 161 lb (73 kg)   BMI 28.52 kg/m    Wt Readings from Last 3 Encounters:  10/05/22 161 lb (73 kg)  10/04/22 160 lb (72.6 kg)  09/18/22 159 lb 4.8 oz (72.3 kg)    GEN: Well nourished, well developed in no acute distress HEENT: Normal, moist mucous membranes NECK: No JVD CARDIAC: regular  rhythm, normal S1 and S2, no rubs or gallops. No murmur. VASCULAR: Radial and DP pulses 2+ bilaterally. No carotid bruits RESPIRATORY:  Clear to auscultation without rales, wheezing or rhonchi  ABDOMEN: Soft, non-tender, non-distended MUSCULOSKELETAL:  Ambulates independently SKIN: Warm and dry, no edema NEUROLOGIC:  Alert and oriented x 3. No focal neuro deficits noted. PSYCHIATRIC:  Normal affect   ASSESSMENT AND PLAN: .    Palpitations Intermittent low blood pressure, asymptomatic Atypical chest pain  -has  not had severe episode since her initial one in April -reviewed red flag warning signs that need immediate medical attention -discussed monitor, but now with infrequent symptoms, unlikely that we will catch an event -she does have an Apple watch with ECG capability. If she has another episode she will record ECG and send it to me -discussed vagal maneuvers just in case this is SVT -recommended gradual increase in cardiovascular exercise to improve vagal tone  CV risk counseling and prevention -recommend heart healthy/Mediterranean diet, with whole grains, fruits, vegetable, fish, lean meats, nuts, and olive oil. Limit salt. -recommend moderate walking, 3-5 times/week for 30-50 minutes each session. Aim for at least 150 minutes.week. Goal should be pace of 3 miles/hours, or walking 1.5 miles in 30 minutes -recommend avoidance of tobacco products. Avoid excess alcohol. -ASCVD risk score: The ASCVD Risk score (Arnett DK, et al., 2019) failed to calculate for the following reasons:   The 2019 ASCVD risk score is only valid for ages 34 to 51    Dispo: Follow-up as needed.   I,Mathew Stumpf,acting as a Neurosurgeon for [employer], MD.,have documented all relevant documentation on the behalf of Jodelle Red, MD,as directed by  Jodelle Red, MD while in the presence of Jodelle Red, MD.  I, Jodelle Red, MD, have reviewed all documentation for this visit. The documentation on 10/05/22 for the exam, diagnosis, procedures, and orders are all accurate and complete.   Signed, Jodelle Red, MD

## 2022-10-07 LAB — BV+CT/NG/TV NAA+YEAST CULTURE
Chlamydia by NAA: NEGATIVE
Gonococcus by NAA: NEGATIVE
Trich vag by NAA: NEGATIVE

## 2022-10-13 ENCOUNTER — Other Ambulatory Visit: Payer: Medicaid Other

## 2022-10-18 ENCOUNTER — Ambulatory Visit (INDEPENDENT_AMBULATORY_CARE_PROVIDER_SITE_OTHER): Payer: Medicaid Other

## 2022-10-18 DIAGNOSIS — N83201 Unspecified ovarian cyst, right side: Secondary | ICD-10-CM | POA: Diagnosis not present

## 2022-10-18 DIAGNOSIS — R1030 Lower abdominal pain, unspecified: Secondary | ICD-10-CM | POA: Diagnosis not present

## 2022-10-18 DIAGNOSIS — D259 Leiomyoma of uterus, unspecified: Secondary | ICD-10-CM | POA: Diagnosis not present

## 2022-10-18 DIAGNOSIS — N83202 Unspecified ovarian cyst, left side: Secondary | ICD-10-CM | POA: Diagnosis not present

## 2022-10-18 DIAGNOSIS — R1084 Generalized abdominal pain: Secondary | ICD-10-CM | POA: Diagnosis not present

## 2022-10-18 DIAGNOSIS — N898 Other specified noninflammatory disorders of vagina: Secondary | ICD-10-CM

## 2022-10-27 ENCOUNTER — Encounter: Payer: Self-pay | Admitting: Family Medicine

## 2022-10-27 ENCOUNTER — Ambulatory Visit (INDEPENDENT_AMBULATORY_CARE_PROVIDER_SITE_OTHER): Payer: Medicaid Other | Admitting: Family Medicine

## 2022-10-27 VITALS — BP 106/70 | HR 86 | Temp 97.8°F | Resp 18 | Ht 63.0 in | Wt 162.7 lb

## 2022-10-27 DIAGNOSIS — K5900 Constipation, unspecified: Secondary | ICD-10-CM | POA: Diagnosis not present

## 2022-10-27 DIAGNOSIS — E282 Polycystic ovarian syndrome: Secondary | ICD-10-CM | POA: Insufficient documentation

## 2022-10-27 DIAGNOSIS — R1031 Right lower quadrant pain: Secondary | ICD-10-CM

## 2022-10-27 DIAGNOSIS — N898 Other specified noninflammatory disorders of vagina: Secondary | ICD-10-CM

## 2022-10-27 NOTE — Progress Notes (Signed)
Established Patient Office Visit  Subjective   Patient ID: Dawn [name], female    DOB: [birth year]  Age: 22 y.o. MRN: [id number]  Chief Complaint  Patient presents with   Vaginitis    Patient is here wanting to test for BV,  she states that she has the symptoms of discharge     HPI  Presents today to get tested for BV due to vaginal discharge. She was tested for STDs on 6/26 with negative Chlamydia, gonorrhea and trich. Has not had new sexual partner since 6/26. Taking oral contraceptives, has not missed pills.  Negative pregnancy on 6/26. No indication to retest for STDs today. Will check for BV due to vaginal discharge.   Vaginal discharge is greenish yellow for one-two times per day, goes away and returns. Last diacharge on Wednesday night.   Abdominal pain: points to epigastric area of  abdomen, no nausea or vomiting, no fever, endorses chills. Endorses constipation.   Review of Systems  Constitutional:  Positive for chills. Negative for fever.  Eyes:  Negative for blurred vision.  Gastrointestinal:  Positive for abdominal pain (epigastric area, off and on) and constipation (LBM 7/18). Negative for diarrhea, nausea and vomiting.  Genitourinary:  Negative for dysuria, frequency and urgency.      Objective:     BP 106/70   Pulse 86   Temp 97.8 F (36.6 C) (Oral)   Resp 18   Ht 5\' 3"  (1.6 m)   Wt 162 lb 11.2 oz (73.8 kg)   SpO2 99%   BMI 28.82 kg/m  BP Readings from Last 3 Encounters:  10/27/22 106/70  10/05/22 96/72  10/04/22 103/63      Physical Exam Vitals and nursing note reviewed.  Constitutional:      Appearance: She is normal weight.  Cardiovascular:     Rate and Rhythm: Regular rhythm.     Heart sounds: Normal heart sounds.  Pulmonary:     Effort: Pulmonary effort is normal.     Breath sounds: Normal breath sounds.  Abdominal:     Palpations: Abdomen is soft.     Tenderness: There is abdominal tenderness (RLQ).  Skin:    General: Skin is  warm and dry.  Neurological:     General: No focal deficit present.     Mental Status: She is alert. Mental status is at baseline.  Psychiatric:        Mood and Affect: Mood normal.        Behavior: Behavior normal.        Thought Content: Thought content normal.        Judgment: Judgment normal.     No results found for any visits on 10/27/22.    The ASCVD Risk score (Arnett DK, et al., 2019) failed to calculate for the following reasons:   The 2019 ASCVD risk score is only valid for ages 20 to 21    Assessment & Plan:   Problem List Items Addressed This Visit     Constipation   Vaginal discharge - Primary   Relevant Orders   NuSwab BV and Candida, NAA   RLQ abdominal pain   Relevant Orders   CT ABDOMEN PELVIS WO CONTRAST  Will fo self swab for BV today. No indication to repeat STD urine today. Abdominal exam reveals RLQ tenderness present for one week CT abdomen/pelvis ordered to rule out pathology. Vital signs are stable, afebrile.  Recommend working toward one BM per day with miralax, adequate water, fruits and  vegetables.  Symptoms reviewed that warrant seeking higher level of care.  Agrees with plan of care discussed.  Questions answered.   Return if symptoms worsen or fail to improve, for abdominal pain .    Novella Olive, FNP

## 2022-10-30 LAB — NUSWAB BV AND CANDIDA, NAA
Candida albicans, NAA: NEGATIVE
Candida glabrata, NAA: NEGATIVE

## 2022-11-15 ENCOUNTER — Ambulatory Visit (HOSPITAL_COMMUNITY)
Admission: RE | Admit: 2022-11-15 | Discharge: 2022-11-15 | Disposition: A | Discharge status: Home / Self Care | Payer: Medicaid Other | Source: Ambulatory Visit | Attending: Internal Medicine | Admitting: Internal Medicine

## 2022-11-15 ENCOUNTER — Encounter (HOSPITAL_COMMUNITY): Payer: Self-pay

## 2022-11-15 VITALS — BP 112/72 | HR 96 | Temp 98.2°F | Resp 16

## 2022-11-15 DIAGNOSIS — N3000 Acute cystitis without hematuria: Secondary | ICD-10-CM | POA: Diagnosis not present

## 2022-11-15 LAB — POCT URINALYSIS DIP (MANUAL ENTRY)
Bilirubin, UA: NEGATIVE
Glucose, UA: NEGATIVE mg/dL
Ketones, POC UA: NEGATIVE mg/dL
Nitrite, UA: NEGATIVE
Protein Ur, POC: NEGATIVE mg/dL
Spec Grav, UA: 1.015 (ref 1.010–1.025)
Urobilinogen, UA: 0.2 E.U./dL
pH, UA: 7 (ref 5.0–8.0)

## 2022-11-15 MED ORDER — SULFAMETHOXAZOLE-TRIMETHOPRIM 800-160 MG PO TABS: 1.0000 | ORAL_TABLET | Freq: Two times a day (BID) | ORAL | 0 refills | Status: AC

## 2022-11-15 MED ORDER — PHENAZOPYRIDINE HCL 95 MG PO TABS: 95.0000 mg | ORAL_TABLET | Freq: Three times a day (TID) | ORAL | 0 refills | Status: DC | PRN

## 2022-11-15 NOTE — ED Provider Notes (Addendum)
[facility]    CSN: [id number] Arrival date & time: 11/15/22  1142      History   Chief Complaint Chief Complaint  Patient presents with   Urinary Frequency    Entered by patient    HPI Dawn [name] is a 22 y.o. female.   Reports urgency, frequency and dysuria since Monday. She has tried to increase her water intake and has been taking cranberry. She denies any nausea, emesis, fever, flank pain or changes to vaginal discharge. Reports some bladder discomfort.  Was trying to treat this at home but it kept getting worse instead of better.   No abx allergies.       The history is provided by the patient and medical records.  Urinary Frequency Pertinent negatives include no abdominal pain.    Past Medical History:  Diagnosis Date   Allergy    Phreesia 09/18/2019   Constipation    Headache    Seasonal allergies     Patient Active Problem List   Diagnosis Date Noted   PCOS (polycystic ovarian syndrome) 10/27/2022   RLQ abdominal pain 10/27/2022   Vaginal discharge 10/04/2022   Urinary urgency 10/04/2022   Unprotected sexual intercourse 10/04/2022   Encounter for female birth control 09/18/2022   Screening for viral disease 08/09/2022   Chest pain 06/28/2020   Swelling of both hands 09/18/2019   Bilateral swelling of feet 09/18/2019   Eosinophilic esophagitis 05/23/2018   Viral URI 05/20/2018   Vaccine reaction, initial encounter 03/18/2018   Generalized abdominal pain 01/10/2018   Gastroesophageal reflux disease 01/10/2018   History of palpitations 01/10/2018   Encounter for lipid screening for cardiovascular disease 09/22/2016   Migraine without aura and without status migrainosus, not intractable 01/05/2016   Poor posture 12/01/2015   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      Home Medications    Prior to Admission medications   Medication Sig Start Date End Date Taking? Authorizing Provider  phenazopyridine (PYRIDIUM) 95 MG tablet Take 1 tablet (95 mg total) by mouth 3 (three) times daily as needed for pain. 11/15/22  Yes Rinaldo Ratel, [place] N, FNP  sulfamethoxazole-trimethoprim (BACTRIM DS) 800-160 MG tablet Take 1 tablet by mouth 2 (two) times daily for 3 days. 11/15/22 11/18/22 Yes Rinaldo Ratel, [place] N, FNP  norgestimate-ethinyl estradiol (SPRINTEC 28) 0.25-35 MG-MCG tablet Take 1 tablet by mouth daily. Patient not taking: Reported on 10/27/2022 09/18/22   Novella Olive, FNP    Family History Family History  Problem Relation Age of Onset   Hypertension Mother    Liver disease Father    Hyperlipidemia Father    Cirrhosis Father    Gallbladder disease Sister    Cancer Maternal Grandmother    Cancer Maternal Grandfather    Cirrhosis Paternal Grandfather    GI problems Neg Hx     Social History Social History   Tobacco Use   Smoking status: Never   Smokeless tobacco: Never  Vaping Use   Vaping status: Never Used  Substance Use Topics   Alcohol use: No   Drug use: No     Allergies   Salami [pickled meat] and Other   Review of Systems Review of Systems  Constitutional:  Negative for  fever.  Gastrointestinal:  Negative for abdominal pain, nausea and vomiting.  Genitourinary:  Positive for dysuria, frequency and urgency. Negative for difficulty urinating, flank pain and vaginal discharge.     Physical Exam Triage Vital Signs ED Triage Vitals  Encounter Vitals Group     BP 11/15/22 1156 112/72     Systolic BP Percentile --      Diastolic BP Percentile --      Pulse Rate 11/15/22 1156 96     Resp 11/15/22 1156 16     Temp 11/15/22 1156 98.2 F (36.8 C)     Temp Source 11/15/22 1156 Oral     SpO2 11/15/22 1156 98 %     Weight --      Height --      Head Circumference --      Peak  Flow --      Pain Score 11/15/22 1155 0     Pain Loc --      Pain Education --      Exclude from Growth Chart --    No data found.  Updated Vital Signs BP 112/72 (BP Location: Left Arm)   Pulse 96   Temp 98.2 F (36.8 C) (Oral)   Resp 16   LMP 11/08/2022   SpO2 98%   Visual Acuity Right Eye Distance:   Left Eye Distance:   Bilateral Distance:    Right Eye Near:   Left Eye Near:    Bilateral Near:     Physical Exam Vitals and nursing note reviewed.  Constitutional:      Appearance: Normal appearance.  HENT:     Head: Normocephalic and atraumatic.     Right Ear: External ear normal.     Left Ear: External ear normal.     Nose: Nose normal.     Mouth/Throat:     Mouth: Mucous membranes are moist.  Eyes:     Conjunctiva/sclera: Conjunctivae normal.  Cardiovascular:     Rate and Rhythm: Normal rate.  Pulmonary:     Effort: Pulmonary effort is normal. No respiratory distress.  Abdominal:     Tenderness: There is no right CVA tenderness or left CVA tenderness.  Musculoskeletal:        General: Normal range of motion.  Skin:    General: Skin is warm and dry.  Neurological:     General: No focal deficit present.     Mental Status: She is alert and oriented to person, place, and time.  Psychiatric:        Mood and Affect: Mood normal.        Behavior: Behavior normal.      UC Treatments / Results  Labs (all labs ordered are listed, but only abnormal results are displayed) Labs Reviewed  POCT URINALYSIS DIP (MANUAL ENTRY) - Abnormal; Notable for the following components:      Result Value   Color, UA light yellow (*)    Clarity, UA cloudy (*)    Blood, UA moderate (*)    Leukocytes, UA Moderate (2+) (*)    All other components within normal limits  URINE CULTURE    EKG   Radiology No results found.  Procedures Procedures (including critical care time)  Medications Ordered in UC Medications - No data to display  Initial Impression / Assessment  and Plan / UC Course  I have reviewed the triage vital signs and the nursing notes.  Pertinent labs & imaging results that were available during my care of  the patient were reviewed by me and considered in my medical decision making (see chart for details).  Vitals in triage reviewed, patient is hemodynamically stable.  Without concern for pyelonephritis, afebrile, without tachycardia, flank pain or emesis.  Urinalysis does show moderate red blood cells and moderate leukocytes, will send for culture.  Placed on 3 days of Bactrim and Azo for symptom relief.  Plan of care, follow-up care and return precautions given, no questions at this time.     Final Clinical Impressions(s) / UC Diagnoses   Final diagnoses:  Acute cystitis without hematuria     Discharge Instructions      Take all antibiotics as prescribed until finished.  Ensure you are drinking at least 64 ounces of water daily, you can continue with the cranberry. For oral contraception and antibiotics please use a back up method such as condom for the next 7 days. We will contact you if we need to modify your antibiotic treatment.   Return to clinic for any new or concerning symptoms.     ED Prescriptions     Medication Sig Dispense Auth. Provider   sulfamethoxazole-trimethoprim (BACTRIM DS) 800-160 MG tablet Take 1 tablet by mouth 2 (two) times daily for 3 days. 6 tablet Rinaldo Ratel, [place] N, FNP   phenazopyridine (PYRIDIUM) 95 MG tablet Take 1 tablet (95 mg total) by mouth 3 (three) times daily as needed for pain. 10 tablet Ledger Heindl, [place] N, FNP      PDMP not reviewed this encounter.     Jiro Kiester, [place] N, [place] 11/15/22 1227

## 2022-11-15 NOTE — Discharge Instructions (Addendum)
Take all antibiotics as prescribed until finished.  Ensure you are drinking at least 64 ounces of water daily, you can continue with the cranberry. For oral contraception and antibiotics please use a back up method such as condom for the next 7 days. We will contact you if we need to modify your antibiotic treatment.   Return to clinic for any new or concerning symptoms.

## 2022-11-15 NOTE — ED Triage Notes (Signed)
Pt reports urge to urinate started on Monday. Having urinary frequency as well. Tried taking Cranberry and drinking lots of fluids.

## 2022-12-20 ENCOUNTER — Ambulatory Visit (HOSPITAL_COMMUNITY)
Admission: EM | Admit: 2022-12-20 | Discharge: 2022-12-20 | Disposition: A | Discharge status: Home / Self Care | Payer: Medicaid Other | Attending: Sports Medicine | Admitting: Sports Medicine

## 2022-12-20 ENCOUNTER — Encounter (HOSPITAL_COMMUNITY): Payer: Self-pay

## 2022-12-20 DIAGNOSIS — Z23 Encounter for immunization: Secondary | ICD-10-CM | POA: Diagnosis not present

## 2022-12-20 DIAGNOSIS — S61412A Laceration without foreign body of left hand, initial encounter: Secondary | ICD-10-CM | POA: Diagnosis not present

## 2022-12-20 MED ORDER — TETANUS-DIPHTHERIA TOXOIDS TD 5-2 LFU IM INJ: 0.5000 mL | INJECTION | Freq: Once | INTRAMUSCULAR | Status: DC

## 2022-12-20 MED ORDER — TETANUS-DIPHTH-ACELL PERTUSSIS 5-2.5-18.5 LF-MCG/0.5 IM SUSY
PREFILLED_SYRINGE | INTRAMUSCULAR | Status: AC
  Filled 2022-12-20: qty 0.5

## 2022-12-20 MED ORDER — TETANUS-DIPHTH-ACELL PERTUSSIS 5-2.5-18.5 LF-MCG/0.5 IM SUSY
0.5000 mL | PREFILLED_SYRINGE | Freq: Once | INTRAMUSCULAR | Status: AC
  Administered 2022-12-20: 0.5 mL via INTRAMUSCULAR

## 2022-12-20 NOTE — Discharge Instructions (Signed)
You were seen for a laceration on the left hand.  There is no evidence of infection though monitor for spreading warmth and redness over the coming days.  Recommend that you use some topical antibiotic ointment but overall try to keep the wound clean and dry for the next week.  Your tetanus booster was updated during today's visit as well.

## 2022-12-20 NOTE — ED Provider Notes (Signed)
[facility]    CSN: [id number] Arrival date & time: 12/20/22  4098      History   Chief Complaint Chief Complaint  Patient presents with   Laceration    HPI Dawn [name] is a 22 y.o. female presenting with complaint of laceration of her left hand.  She states she cut it last night on a nail.  Bleeding was minimal and she states she thoroughly irrigated it with tap water.  Following this she used superficial skin glue to keep it well-approximated.  Pain is minimal and is no active bleeding.  Her last tetanus shot was back in 2015.  Laceration Associated symptoms: no fever and no rash     Past Medical History:  Diagnosis Date   Allergy    [employer]rees[employer] 09/18/2019   Constipation    Headache    Seasonal allergies     Patient Active Problem List   Diagnosis Date Noted   PCOS (polycystic ovarian syndrome) 10/27/2022   RLQ abdominal pain 10/27/2022   Vaginal discharge 10/04/2022   Urinary urgency 10/04/2022   Unprotected sexual intercourse 10/04/2022   Encounter for female birth control 09/18/2022   Screening for viral disease 08/09/2022   Chest pain 06/28/2020   Swelling of both hands 09/18/2019   Bilateral swelling of feet 09/18/2019   Eosinophilic esophagitis 05/23/2018   Viral URI 05/20/2018   Vaccine reaction, initial encounter 03/18/2018   Generalized abdominal pain 01/10/2018   Gastroesophageal reflux disease 01/10/2018   History of palpitations 01/10/2018   Encounter for lipid screening for cardiovascular disease 09/22/2016   Migraine without aura and without status migrainosus, not intractable 01/05/2016   Poor posture 12/01/2015   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      Home Medications    Prior to Admission medications    Medication Sig Start Date End Date Taking? Authorizing Provider  norgestimate-ethinyl estradiol (SPRINTEC 28) 0.25-35 MG-MCG tablet Take 1 tablet by mouth daily. Patient not taking: Reported on 10/27/2022 09/18/22   Novella Olive, FNP  phenazopyridine (PYRIDIUM) 95 MG tablet Take 1 tablet (95 mg total) by mouth 3 (three) times daily as needed for pain. 11/15/22   Garrison, [place] N, FNP    Family History Family History  Problem Relation Age of Onset   Hypertension Mother    Liver disease Father    Hyperlipidemia Father    Cirrhosis Father    Gallbladder disease Sister    Cancer Maternal Grandmother    Cancer Maternal Grandfather    Cirrhosis Paternal Grandfather    GI problems Neg Hx     Social History Social History   Tobacco Use   Smoking status: Never   Smokeless tobacco: Never  Vaping Use   Vaping status: Never Used  Substance Use Topics   Alcohol use: No   Drug use: No     Allergies   Salami [pickled meat] and Other   Review of Systems Review of Systems  Constitutional:  Negative for chills and fever.  Skin:  Positive for wound. Negative for color change, pallor and rash.     Physical Exam Triage Vital Signs ED Triage Vitals  Encounter Vitals Group     BP 12/20/22 0829 124/78  Systolic BP Percentile --      Diastolic BP Percentile --      Pulse Rate 12/20/22 0829 79     Resp 12/20/22 0829 16     Temp 12/20/22 0829 98 F (36.7 C)     Temp Source 12/20/22 0829 Oral     SpO2 12/20/22 0829 98 %     Weight --      Height --      Head Circumference --      Peak Flow --      Pain Score 12/20/22 0831 2     Pain Loc --      Pain Education --      Exclude from Growth Chart --    No data found.  Updated Vital Signs BP 124/78 (BP Location: Left Arm)   Pulse 79   Temp 98 F (36.7 C) (Oral)   Resp 16   LMP 12/06/2022 (Exact Date)   SpO2 98%   Visual Acuity Right Eye Distance:   Left Eye Distance:   Bilateral Distance:    Right Eye Near:    Left Eye Near:    Bilateral Near:     Physical Exam Constitutional:      General: She is not in acute distress.    Appearance: Normal appearance.  Musculoskeletal:     Comments: Left hand without obvious deformity, swelling, erythema, or ecchymosis.  Full range of motion in all digits.  Strength is intact.  Sensation is intact.  Skin:    Comments: 1 cm superficial laceration on the ulnar aspect of the left hand.  Wound edges well-approximated and overlying skin glue present and dry.  No visible foreign body and nontender to palpation.  Neurological:     Mental Status: She is alert.      UC Treatments / Results  Labs (all labs ordered are listed, but only abnormal results are displayed) Labs Reviewed - No data to display  EKG   Radiology No results found.  Procedures Procedures (including critical care time)  Medications Ordered in UC Medications  Tdap (BOOSTRIX) injection 0.5 mL (0.5 mLs Intramuscular Given 12/20/22 0903)    Initial Impression / Assessment and Plan / UC Course  I have reviewed the triage vital signs and the nursing notes.  Pertinent labs & imaging results that were available during my care of the patient were reviewed by me and considered in my medical decision making (see chart for details).    Patient with small superficial lacerations of the left hand without evidence of infection or foreign body.  Instructed her to keep the wound clean and dry for the next 5 days.  She is not up-to-date on her tetanus immunization so was provided a Tdap booster today.  She is stable for discharge and is aware of signs and symptoms to monitor for evidence of infection.  Final Clinical Impressions(s) / UC Diagnoses   Final diagnoses:  Laceration of left hand, foreign body presence unspecified, initial encounter     Discharge Instructions      You were seen for a laceration on the left hand.  There is no evidence of infection though monitor for spreading warmth  and redness over the coming days.  Recommend that you use some topical antibiotic ointment but overall try to keep the wound clean and dry for the next week.  Your tetanus booster was updated during today's visit as well.     ED Prescriptions   None    PDMP not  reviewed this encounter.   Marisa Cyphers, MD 12/20/22 [phone]

## 2022-12-20 NOTE — ED Triage Notes (Signed)
Pt states laceration to left hand yesterday.  States she cut it on a nail. Small lac noted to left hand.

## 2023-01-30 ENCOUNTER — Encounter (HOSPITAL_COMMUNITY): Payer: Self-pay | Admitting: *Deleted

## 2023-01-30 ENCOUNTER — Ambulatory Visit (HOSPITAL_COMMUNITY)
Admission: EM | Admit: 2023-01-30 | Discharge: 2023-01-30 | Disposition: A | Discharge status: Home / Self Care | Payer: Medicaid Other | Attending: Internal Medicine | Admitting: Internal Medicine

## 2023-01-30 DIAGNOSIS — J069 Acute upper respiratory infection, unspecified: Secondary | ICD-10-CM

## 2023-01-30 MED ORDER — BENZONATATE 100 MG PO CAPS: 100.0000 mg | ORAL_CAPSULE | Freq: Three times a day (TID) | ORAL | 0 refills | Status: DC

## 2023-01-30 MED ORDER — PROMETHAZINE-DM 6.25-15 MG/5ML PO SYRP: 5.0000 mL | ORAL_SOLUTION | Freq: Every evening | ORAL | 0 refills | Status: DC | PRN

## 2023-01-30 NOTE — ED Provider Notes (Signed)
[facility]    CSN: [id number] Arrival date & time: 01/30/23  1151      History   Chief Complaint Chief Complaint  Patient presents with   Cough   Sore Throat   Chills   Diarrhea    HPI Dawn [name] is a 22 y.o. female.   Patient presents to urgent care for evaluation of cough, nasal congestion, sore throat, fever/chills that started 5 days ago.  Cough is sometimes dry and sometimes productive with clear sputum.  Denies shortness of breath, chest pain, heart palpitations, nausea, vomiting, abdominal pain, rash, and documented fever at home.  Reports significant chills.  She started having some loose stools/diarrhea this morning.  No dizziness or vision changes/headache.  No history of chronic respiratory problems.  Never smoker, denies drug use.  Taking Mucinex, DayQuil, NyQuil, and other over-the-counter medications with some relief.  Multiple sick contacts in the household with similar symptoms.   Cough Sore Throat  Diarrhea   Past Medical History:  Diagnosis Date   Allergy    Phreesia 09/18/2019   Constipation    Headache    Seasonal allergies     Patient Active Problem List   Diagnosis Date Noted   PCOS (polycystic ovarian syndrome) 10/27/2022   RLQ abdominal pain 10/27/2022   Vaginal discharge 10/04/2022   Urinary urgency 10/04/2022   Unprotected sexual intercourse 10/04/2022   Encounter for female birth control 09/18/2022   Screening for viral disease 08/09/2022   Chest pain 06/28/2020   Swelling of both hands 09/18/2019   Bilateral swelling of feet 09/18/2019   Eosinophilic esophagitis 05/23/2018   Viral URI 05/20/2018   Vaccine reaction, initial encounter 03/18/2018   Generalized abdominal pain 01/10/2018   Gastroesophageal reflux disease 01/10/2018   History of palpitations 01/10/2018   Encounter for lipid screening for cardiovascular disease 09/22/2016   Migraine without aura and without status migrainosus, not intractable  01/05/2016   Poor posture 12/01/2015   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      Home Medications    Prior to Admission medications   Medication Sig Start Date End Date Taking? Authorizing Provider  benzonatate (TESSALON) 100 MG capsule Take 1 capsule (100 mg total) by mouth every 8 (eight) hours. 01/30/23  Yes Carlisle Beers, FNP  norgestimate-ethinyl estradiol (SPRINTEC 28) 0.25-35 MG-MCG tablet Take 1 tablet by mouth daily. 09/18/22  Yes Novella Olive, FNP  promethazine-dextromethorphan (PROMETHAZINE-DM) 6.25-15 MG/5ML syrup Take 5 mLs by mouth at bedtime as needed. 01/30/23  Yes Carlisle Beers, FNP  phenazopyridine (PYRIDIUM) 95 MG tablet Take 1 tablet (95 mg total) by mouth 3 (three) times daily as needed for pain. 11/15/22   Garrison, [place] N, FNP    Family History Family History  Problem Relation Age of Onset   Hypertension Mother    Liver disease Father    Hyperlipidemia Father    Cirrhosis Father    Gallbladder disease Sister    Cancer Maternal Grandmother    Cancer Maternal Grandfather    Cirrhosis Paternal Grandfather    GI problems Neg Hx     Social History Social History   Tobacco Use   Smoking status: Never   Smokeless tobacco: Never  Vaping Use  Vaping status: Never Used  Substance Use Topics   Alcohol use: No   Drug use: No     Allergies   Salami [pickled meat]   Review of Systems Review of Systems  Respiratory:  Positive for cough.   Gastrointestinal:  Positive for diarrhea.  Per HPI   Physical Exam Triage Vital Signs ED Triage Vitals  Encounter Vitals Group     BP 01/30/23 1212 114/70     Systolic BP Percentile --      Diastolic BP Percentile --      Pulse Rate 01/30/23 1212 (!) 112     Resp 01/30/23  1212 16     Temp 01/30/23 1212 (!) 97.2 F (36.2 C)     Temp Source 01/30/23 1212 Oral     SpO2 01/30/23 1212 96 %     Weight --      Height --      Head Circumference --      Peak Flow --      Pain Score 01/30/23 1210 6     Pain Loc --      Pain Education --      Exclude from Growth Chart --    No data found.  Updated Vital Signs BP 114/70 (BP Location: Left Arm)   Pulse (!) 112   Temp (!) 97.2 F (36.2 C) (Oral)   Resp 16   LMP 01/02/2023 (Approximate)   SpO2 96%   Visual Acuity Right Eye Distance:   Left Eye Distance:   Bilateral Distance:    Right Eye Near:   Left Eye Near:    Bilateral Near:     Physical Exam Vitals and nursing note reviewed.  Constitutional:      Appearance: She is not ill-appearing or toxic-appearing.  HENT:     Head: Normocephalic and atraumatic.     Right Ear: Hearing, tympanic membrane, ear canal and external ear normal.     Left Ear: Hearing, tympanic membrane, ear canal and external ear normal.     Nose: Nose normal.     Mouth/Throat:     Lips: Pink.     Mouth: Mucous membranes are moist. No injury.     Tongue: No lesions. Tongue does not deviate from midline.     Palate: No mass and lesions.     Pharynx: Oropharynx is clear. Uvula midline. Posterior oropharyngeal erythema present. No pharyngeal swelling, oropharyngeal exudate or uvula swelling.     Tonsils: No tonsillar exudate or tonsillar abscesses. 2+ on the right. 2+ on the left.  Eyes:     General: Lids are normal. Vision grossly intact. Gaze aligned appropriately.     Extraocular Movements: Extraocular movements intact.     Conjunctiva/sclera: Conjunctivae normal.  Cardiovascular:     Rate and Rhythm: Normal rate and regular rhythm.     Heart sounds: Normal heart sounds, S1 normal and S2 normal.  Pulmonary:     Effort: Pulmonary effort is normal. No respiratory distress.     Breath sounds: Normal breath sounds and air entry. No wheezing, rhonchi or rales.  Chest:      Chest wall: No tenderness.  Musculoskeletal:     Cervical back: Neck supple.  Skin:    General: Skin is warm and dry.     Capillary Refill: Capillary refill takes less than 2 seconds.     Findings: No rash.  Neurological:     General: No focal deficit present.     Mental Status: She is alert  and oriented to person, place, and time. Mental status is at baseline.     Cranial Nerves: No dysarthria or facial asymmetry.  Psychiatric:        Mood and Affect: Mood normal.        Speech: Speech normal.        Behavior: Behavior normal.        Thought Content: Thought content normal.        Judgment: Judgment normal.      UC Treatments / Results  Labs (all labs ordered are listed, but only abnormal results are displayed) Labs Reviewed - No data to display  EKG   Radiology No results found.  Procedures Procedures (including critical care time)  Medications Ordered in UC Medications - No data to display  Initial Impression / Assessment and Plan / UC Course  I have reviewed the triage vital signs and the nursing notes.  Pertinent labs & imaging results that were available during my care of the patient were reviewed by me and considered in my medical decision making (see chart for details).   1.  Viral URI with cough Suspect viral URI, viral syndrome. Physical exam findings reassuring, vital signs hemodynamically stable. Low suspicion for pneumonia/acute cardiopulmonary abnormality, therefore deferred imaging of the chest. Advised supportive care, offered prescriptions for symptomatic relief.  Recommend continued use of OTC medications as needed, recommendations discussed with patient/caregiver and outlined in AVS.  Strep/viral testing: Deferred based on timing of illness, she is not a candidate for antiviral therapy.   Counseled patient on potential for adverse effects with medications prescribed/recommended today, strict ER and return-to-clinic precautions discussed, patient  verbalized understanding.    Final Clinical Impressions(s) / UC Diagnoses   Final diagnoses:  Viral URI with cough     Discharge Instructions      You have a viral illness which will improve on its own with rest, fluids, and medications to help with your symptoms. We discussed prescriptions that may help with your symptoms: tessalon perles, promethazine DM You may use over the counter medicines as needed: tylenol/motrin, mucinex, zyrtec, Flonase Two teaspoons of honey in 1 cup of warm water every 4-6 hours may help with throat pains. Humidifier in room at nighttime may help soothe cough (clean well daily).   For chest pain, shortness of breath, inability to keep food or fluids down without vomiting, fever that does not respond to tylenol or motrin, or any other severe symptoms, please go to the ER for further evaluation. Return to urgent care as needed, otherwise follow-up with PCP.      ED Prescriptions     Medication Sig Dispense Auth. Provider   promethazine-dextromethorphan (PROMETHAZINE-DM) 6.25-15 MG/5ML syrup Take 5 mLs by mouth at bedtime as needed. 118 mL Reita May M, FNP   benzonatate (TESSALON) 100 MG capsule Take 1 capsule (100 mg total) by mouth every 8 (eight) hours. 21 capsule Carlisle Beers, FNP      PDMP not reviewed this encounter.   Carlisle Beers, [place] 01/30/23 1236

## 2023-01-30 NOTE — ED Triage Notes (Signed)
Pt states she has had cough, congestion, sore throat, chills since Friday. She has been taking mucinex and nyquil which hasn't been helping. She started with diarrhea today.

## 2023-01-30 NOTE — Discharge Instructions (Addendum)
You have a viral illness which will improve on its own with rest, fluids, and medications to help with your symptoms. We discussed prescriptions that may help with your symptoms: tessalon perles, promethazine DM You may use over the counter medicines as needed: tylenol/motrin, mucinex, zyrtec, Flonase Two teaspoons of honey in 1 cup of warm water every 4-6 hours may help with throat pains. Humidifier in room at nighttime may help soothe cough (clean well daily).   For chest pain, shortness of breath, inability to keep food or fluids down without vomiting, fever that does not respond to tylenol or motrin, or any other severe symptoms, please go to the ER for further evaluation. Return to urgent care as needed, otherwise follow-up with PCP.

## 2023-02-10 IMAGING — CT CT RENAL STONE PROTOCOL
2 of 4 series · 16 of 46 positions shown, 18 images · non-contrast
Comparison: None.

CLINICAL DATA: Left lateral abdominal pain

EXAM:
CT ABDOMEN AND PELVIS WITHOUT CONTRAST
TECHNIQUE: Multidetector CT imaging of the abdomen and pelvis was performed
following the standard protocol without IV contrast.

[Series 3: renal stone 5.0 · axial · 0.88mm/px · z∈[+730,+1135]mm · 13 of 89 slices shown, 15 images]
[im 4/89  soft-tissue]
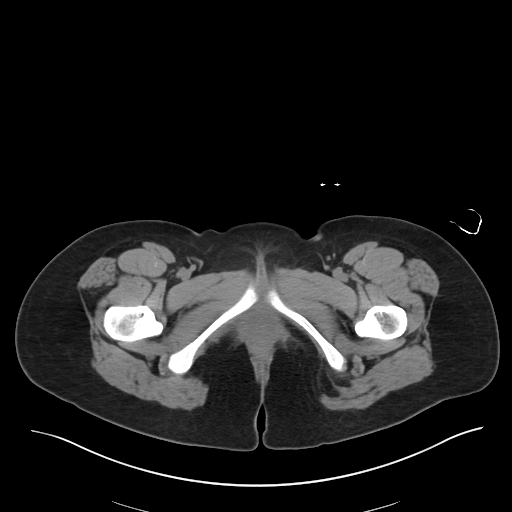
[im 4/89  bone]
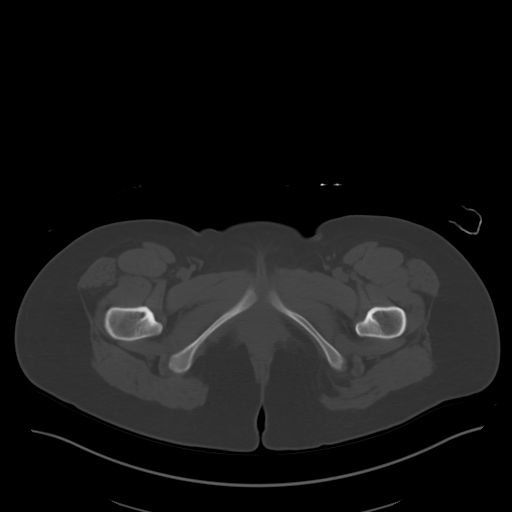
[im 11/89  soft-tissue]
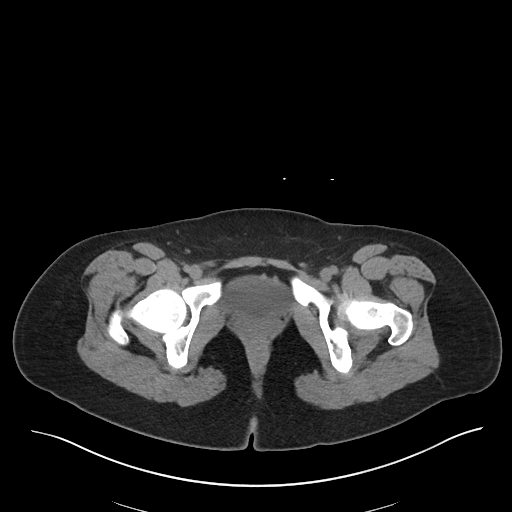
[im 17/89  soft-tissue]
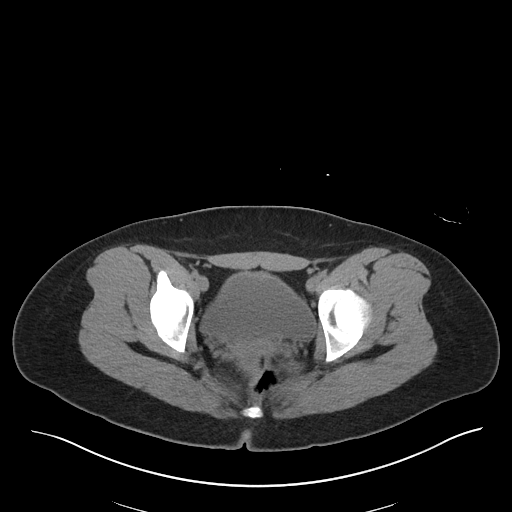
[im 24/89  soft-tissue]
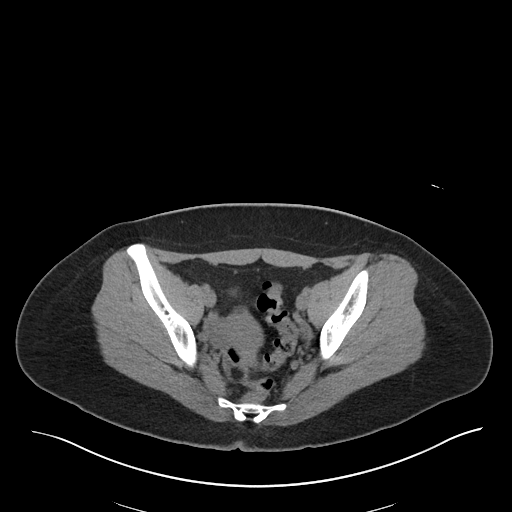
[im 31/89  soft-tissue]
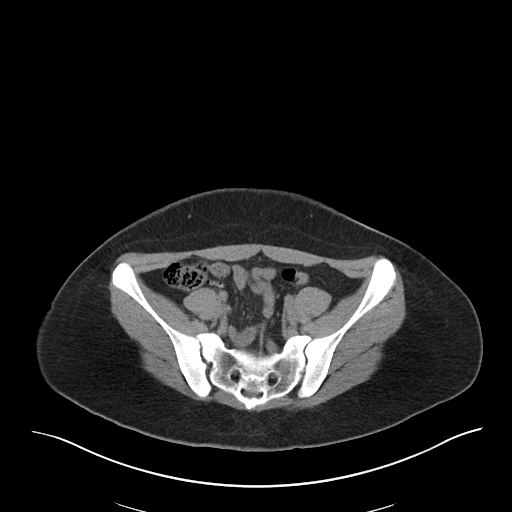
[im 38/89  soft-tissue]
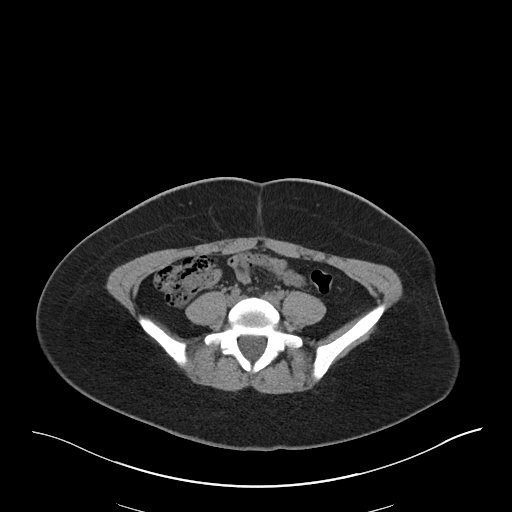
[im 45/89  soft-tissue]
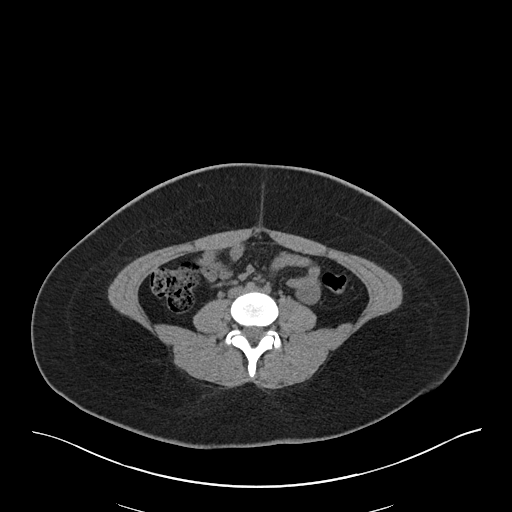
[im 51/89  soft-tissue]
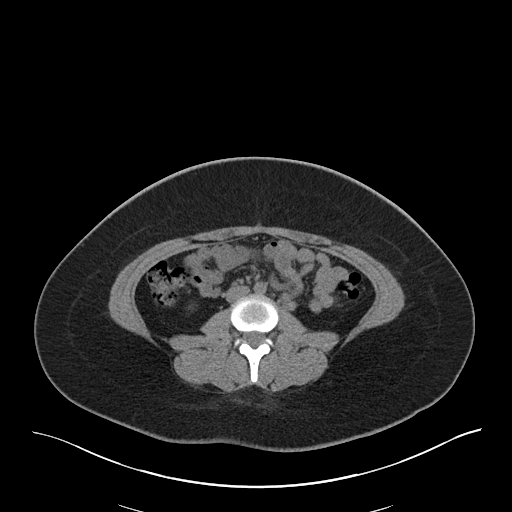
[im 58/89  soft-tissue]
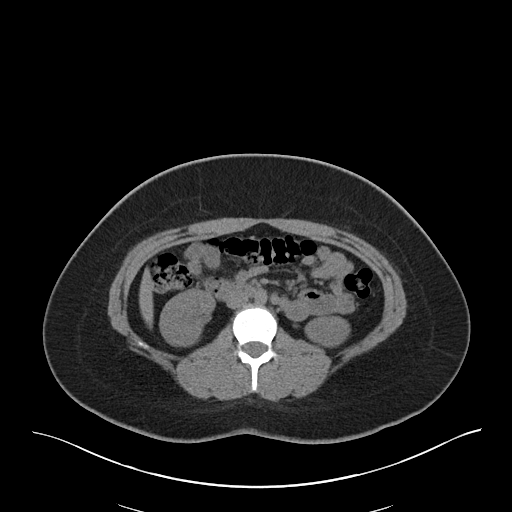
[im 58/89  bone]
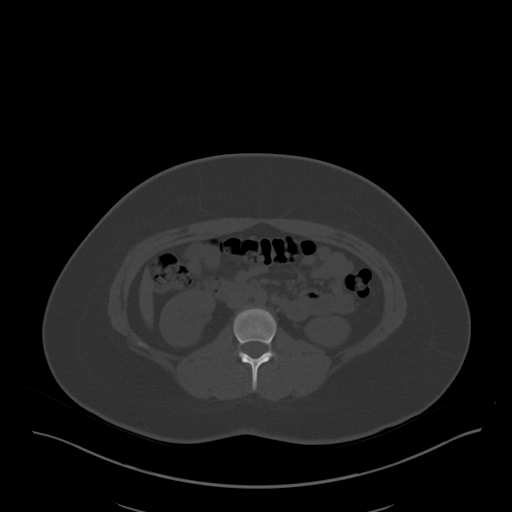
[im 65/89  soft-tissue]
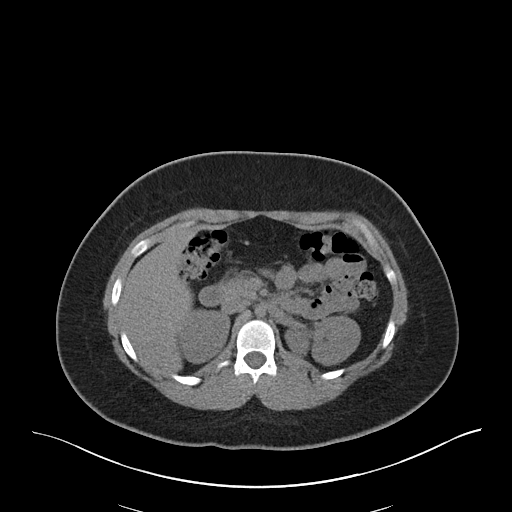
[im 72/89  soft-tissue]
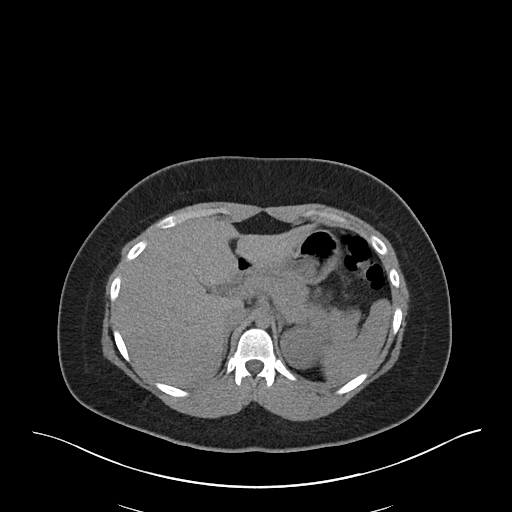
[im 78/89  soft-tissue]
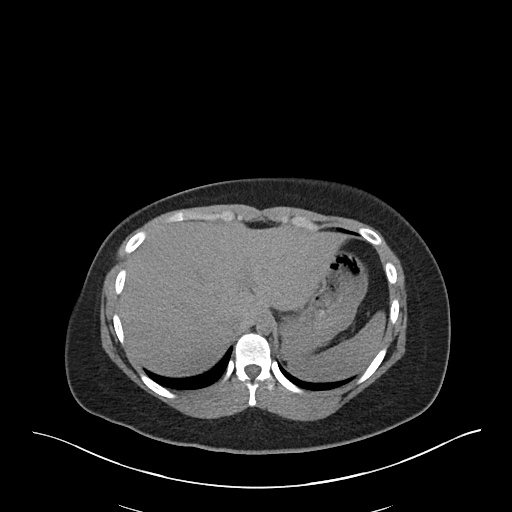
[im 85/89  soft-tissue]
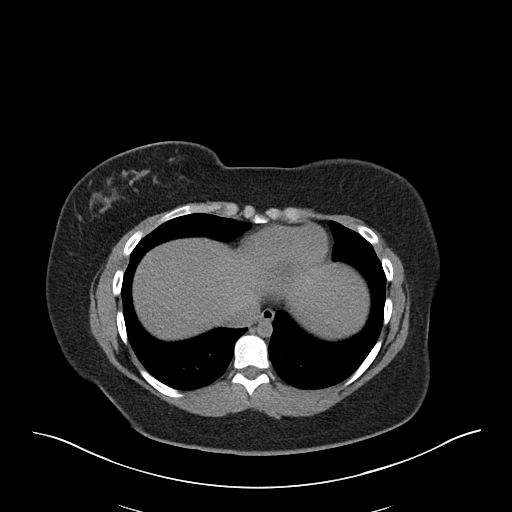

[Series 5: coronal · coronal · 0.70mm/px · 3 of 101 slices shown]
[im 34/101  soft-tissue]
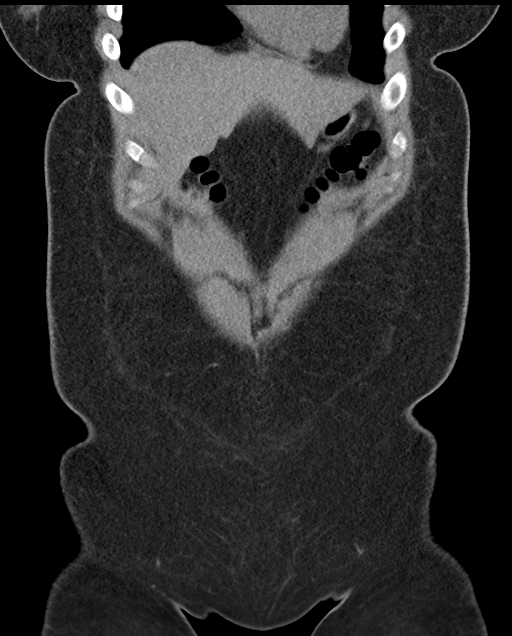
[im 45/101  soft-tissue]
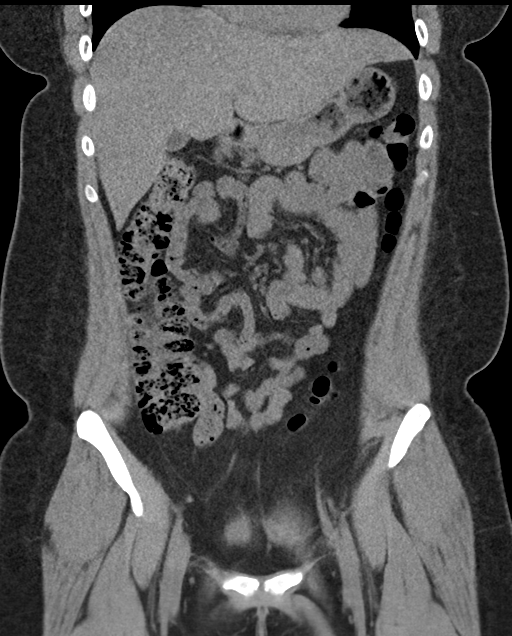
[im 56/101  soft-tissue]
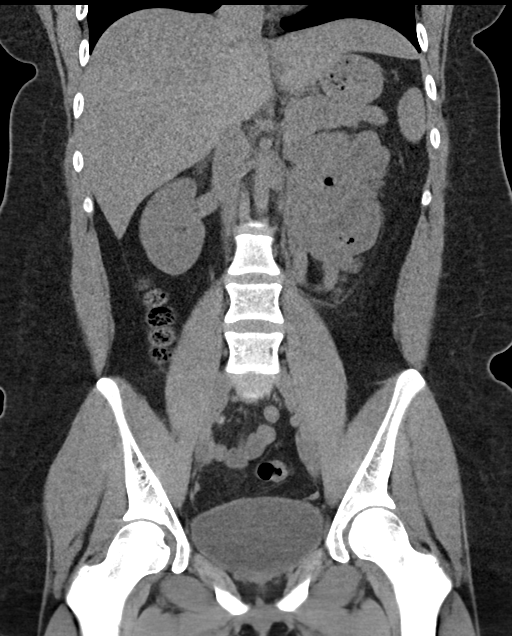

[16 of 46 positions shown; findings below may reference images not displayed]

FINDINGS: Lower chest: The visualized lung bases are clear bilaterally. The
visualized heart and pericardium are unremarkable.

Hepatobiliary: No focal liver abnormality is seen. No gallstones,
gallbladder wall thickening, or biliary dilatation.

Pancreas: Unremarkable

Spleen: Unremarkable

Adrenals/Urinary Tract: Adrenal glands are unremarkable. Kidneys are
normal, without renal calculi, focal lesion, or hydronephrosis.
Bladder is unremarkable.

Stomach/Bowel: Stomach is within normal limits. Appendix appears
normal. No evidence of bowel wall thickening, distention, or
inflammatory changes. No free intraperitoneal gas.

Vascular/Lymphatic: No significant vascular findings are present. No
enlarged abdominal or pelvic lymph nodes.

Reproductive: Uterus and bilateral adnexa are unremarkable. Trace
free fluid within the pelvis is noted, possibly physiologic in a
female patient of this age.

Other: No abdominal wall hernia.  Rectum unremarkable.

Musculoskeletal: No acute bone abnormality.
IMPRESSION: No acute intra-abdominal pathology identified. No radiographic
explanation for the patient's reported symptoms.

Trace free fluid in the pelvis, possibly physiologic in a female
patient of this age.

## 2023-02-25 ENCOUNTER — Ambulatory Visit (HOSPITAL_COMMUNITY)
Admission: RE | Admit: 2023-02-25 | Discharge: 2023-02-25 | Disposition: A | Discharge status: Home / Self Care | Payer: Medicaid Other | Source: Ambulatory Visit | Attending: Physician Assistant | Admitting: Physician Assistant

## 2023-02-25 ENCOUNTER — Ambulatory Visit (INDEPENDENT_AMBULATORY_CARE_PROVIDER_SITE_OTHER): Payer: Medicaid Other

## 2023-02-25 ENCOUNTER — Encounter (HOSPITAL_COMMUNITY): Payer: Self-pay

## 2023-02-25 VITALS — BP 115/74 | HR 87 | Temp 98.4°F | Resp 16

## 2023-02-25 DIAGNOSIS — J188 Other pneumonia, unspecified organism: Secondary | ICD-10-CM | POA: Diagnosis not present

## 2023-02-25 DIAGNOSIS — J189 Pneumonia, unspecified organism: Secondary | ICD-10-CM | POA: Diagnosis not present

## 2023-02-25 MED ORDER — PROMETHAZINE-DM 6.25-15 MG/5ML PO SYRP: 5.0000 mL | ORAL_SOLUTION | Freq: Two times a day (BID) | ORAL | 0 refills | Status: DC | PRN

## 2023-02-25 MED ORDER — DOXYCYCLINE HYCLATE 100 MG PO CAPS: 100.0000 mg | ORAL_CAPSULE | Freq: Two times a day (BID) | ORAL | 0 refills | Status: DC

## 2023-02-25 NOTE — ED Triage Notes (Signed)
Patient reports that she was seen on 01/30/23 and was told she had a viral illness.  Patient states she has had intermittent coughing, fever, and abdominal pain. Patient states lastly she began having a non productive cough, fever, and mid/lower abdominal pain x 5 days. Patient also reports that family members have been diagnosed with pneumonia.   Patient states she has been taking Sudafed.

## 2023-02-25 NOTE — ED Provider Notes (Signed)
[facility]    CSN: [id number] Arrival date & time: 02/25/23  1018      History   Chief Complaint Chief Complaint  Patient presents with   Cough    I haven't been feeling well since I last went to urgent care still been feeling the same most of my family have been diagnosed with pneumonia I feel like I might have it too - Entered by patient   Fever   Abdominal Pain    HPI Dawn [name] is a 22 y.o. female.   Patient presents today with a several week history of URI symptoms.  She reports several family members who have been sick with similar symptoms including several kids with been diagnosed with walking pneumonia.  She was seen by our clinic on 01/30/2023 at which point symptoms were attributed to a viral illness.  She was prescribed promethazine DM and Tessalon which were minimally effective but she has since run out of these medications.  She has used Sudafed over-the-counter without improvement.  She continues to have severe cough, shortness of breath, subjective fever, congestion, fatigue/malaise.  She denies any chest pain, nausea, vomiting, diarrhea.  She denies any history of allergies, asthma, COPD.  She does not smoke cigarettes.  She is no concern for pregnancy.  She is concerned because her symptoms have been worsening prompting reevaluation today.    Past Medical History:  Diagnosis Date   Allergy    Phreesia 09/18/2019   Constipation    Headache    Seasonal allergies     Patient Active Problem List   Diagnosis Date Noted   PCOS (polycystic ovarian syndrome) 10/27/2022   RLQ abdominal pain 10/27/2022   Vaginal discharge 10/04/2022   Urinary urgency 10/04/2022   Unprotected sexual intercourse 10/04/2022   Encounter for female birth control 09/18/2022   Screening for viral disease 08/09/2022   Chest pain 06/28/2020   Swelling of both hands 09/18/2019   Bilateral swelling of feet 09/18/2019   Eosinophilic esophagitis 05/23/2018   Viral URI  05/20/2018   Vaccine reaction, initial encounter 03/18/2018   Generalized abdominal pain 01/10/2018   Gastroesophageal reflux disease 01/10/2018   History of palpitations 01/10/2018   Encounter for lipid screening for cardiovascular disease 09/22/2016   Migraine without aura and without status migrainosus, not intractable 01/05/2016   Poor posture 12/01/2015   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      Home Medications    Prior to Admission medications   Medication Sig Start Date End Date Taking? Authorizing Provider  doxycycline (VIBRAMYCIN) 100 MG capsule Take 1 capsule (100 mg total) by mouth 2 (two) times daily. 02/25/23  Yes Jeanann Balinski, Noberto Retort, PA-C  norgestimate-ethinyl estradiol (SPRINTEC 28) 0.25-35 MG-MCG tablet Take 1 tablet by mouth daily. 09/18/22   Novella Olive, FNP  promethazine-dextromethorphan (PROMETHAZINE-DM) 6.25-15 MG/5ML syrup Take 5 mLs by mouth 2 (two) times daily as needed. 02/25/23   Gregory Dowe, Noberto Retort, PA-C    Family History Family History  Problem Relation Age of Onset   Hypertension Mother    Liver disease Father    Hyperlipidemia Father    Cirrhosis Father    Gallbladder disease Sister    Cancer Maternal Grandmother    Cancer  Maternal Grandfather    Cirrhosis Paternal Grandfather    GI problems Neg Hx     Social History Social History   Tobacco Use   Smoking status: Never   Smokeless tobacco: Never  Vaping Use   Vaping status: Never Used  Substance Use Topics   Alcohol use: No   Drug use: No     Allergies   Salami [pickled meat]   Review of Systems Review of Systems  Constitutional:  Positive for activity change, fatigue and fever (subjective). Negative for appetite change.  HENT:  Positive for congestion. Negative for  sinus pressure, sneezing and sore throat.   Respiratory:  Positive for cough and shortness of breath.   Cardiovascular:  Negative for chest pain.  Gastrointestinal:  Negative for abdominal pain, diarrhea, nausea and vomiting.  Neurological:  Negative for dizziness, light-headedness and headaches.     Physical Exam Triage Vital Signs ED Triage Vitals [02/25/23 1034]  Encounter Vitals Group     BP 115/74     Systolic BP Percentile      Diastolic BP Percentile      Pulse Rate 87     Resp 16     Temp 98.4 F (36.9 C)     Temp Source Oral     SpO2 95 %     Weight      Height      Head Circumference      Peak Flow      Pain Score 0     Pain Loc      Pain Education      Exclude from Growth Chart    No data found.  Updated Vital Signs BP 115/74 (BP Location: Right Arm)   Pulse 87   Temp 98.4 F (36.9 C) (Oral)   Resp 16   LMP 02/06/2023 (Approximate)   SpO2 95%   Visual Acuity Right Eye Distance:   Left Eye Distance:   Bilateral Distance:    Right Eye Near:   Left Eye Near:    Bilateral Near:     Physical Exam Vitals reviewed.  Constitutional:      General: She is awake. She is not in acute distress.    Appearance: Normal appearance. She is well-developed. She is not ill-appearing.     Comments: Very pleasant female appears stated age in no acute distress sitting comfortably in exam room  HENT:     Head: Normocephalic and atraumatic.     Right Ear: Tympanic membrane, ear canal and external ear normal. Tympanic membrane is not erythematous or bulging.     Left Ear: Tympanic membrane, ear canal and external ear normal. Tympanic membrane is not erythematous or bulging.     Nose:     Right Sinus: No maxillary sinus tenderness or frontal sinus tenderness.     Left Sinus: No maxillary sinus tenderness or frontal sinus tenderness.     Mouth/Throat:     Pharynx: Uvula midline. Postnasal drip present. No oropharyngeal exudate or posterior oropharyngeal erythema.   Cardiovascular:     Rate and Rhythm: Normal rate and regular rhythm.     Heart sounds: Normal heart sounds, S1 normal and S2 normal. No murmur heard. Pulmonary:     Effort: Pulmonary effort is normal.     Breath sounds: Examination of the right-lower field reveals decreased breath sounds. Examination of the left-lower field reveals decreased breath sounds. Decreased breath sounds present. No wheezing, rhonchi or rales.  Musculoskeletal:     Right lower leg:  No edema.     Left lower leg: No edema.  Psychiatric:        Behavior: Behavior is cooperative.      UC Treatments / Results  Labs (all labs ordered are listed, but only abnormal results are displayed) Labs Reviewed - No data to display  EKG   Radiology DG Chest 2 View  Result Date: 02/25/2023 CLINICAL DATA:  Worsening cough for several weeks, fever EXAM: CHEST - 2 VIEW COMPARISON:  02/05/2021 FINDINGS: Frontal and lateral views of the chest demonstrate an unremarkable cardiac silhouette. No airspace disease, effusion, or pneumothorax. No acute bony abnormalities. IMPRESSION: 1. No acute intrathoracic process. Electronically Signed   By: Sharlet Salina M.D.   On: 02/25/2023 11:16    Procedures Procedures (including critical care time)  Medications Ordered in UC Medications - No data to display  Initial Impression / Assessment and Plan / UC Course  I have reviewed the triage vital signs and the nursing notes.  Pertinent labs & imaging results that were available during my care of the patient were reviewed by me and considered in my medical decision making (see chart for details).     Patient is well-appearing, afebrile, nontoxic, nontachycardic.  No indication for viral testing as she has been symptomatic for several weeks and this would not change our management.  Chest x-ray was obtained that showed patchy opacities concerning for atypical pneumonia based on my primary reading.  At the time of discharge we were waiting  for radiologist over read and we will contact her if this differs and changes our treatment plan.  She was started on doxycycline 100 mg twice daily for 10 days.  We discussed that this and all antibiotics can decrease the effectiveness of birth control so she should use backup birth control on this medication until her next menstrual cycle.  She was given Promethazine DM for cough.  We discussed that this can be sedating so she should not drive or drink alcohol while taking it.  She can use over-the-counter medication for additional symptom relief.  If her symptoms are not improving within a few days she is to return for reevaluation.  If anything worsens she needs to be seen immediately.  Strict return precautions given.  Work excuse note provided.  Final Clinical Impressions(s) / UC Diagnoses   Final diagnoses:  Atypical pneumonia     Discharge Instructions      I believe that you have atypical pneumonia.  Please start doxycycline 100 mg twice daily for 10 days.  Stay out of the sun while on this medication as it can cause you to have a sunburn.  This and all antibiotics can decrease the effectiveness of your birth control so please use backup birth control such as a condom while on this medication until your next menstrual cycle.  Take Promethazine DM for cough.  Use over-the-counter medications such as Mucinex, Flonase, Tylenol, nasal saline/sinus rinses.  Make sure that you rest and drink plenty of fluid.  If your symptoms are not improving within 3 to 5 days or if anything worsens you need to be seen immediately.     ED Prescriptions     Medication Sig Dispense Auth. Provider   promethazine-dextromethorphan (PROMETHAZINE-DM) 6.25-15 MG/5ML syrup Take 5 mLs by mouth 2 (two) times daily as needed. 118 mL Tinaya Ceballos K, PA-C   doxycycline (VIBRAMYCIN) 100 MG capsule Take 1 capsule (100 mg total) by mouth 2 (two) times daily. 20 capsule Lonn Im, Noberto Retort, PA-C  PDMP not reviewed this  encounter.   Jeani Hawking, PA-C 02/25/23 1119

## 2023-02-25 NOTE — Discharge Instructions (Signed)
I believe that you have atypical pneumonia.  Please start doxycycline 100 mg twice daily for 10 days.  Stay out of the sun while on this medication as it can cause you to have a sunburn.  This and all antibiotics can decrease the effectiveness of your birth control so please use backup birth control such as a condom while on this medication until your next menstrual cycle.  Take Promethazine DM for cough.  Use over-the-counter medications such as Mucinex, Flonase, Tylenol, nasal saline/sinus rinses.  Make sure that you rest and drink plenty of fluid.  If your symptoms are not improving within 3 to 5 days or if anything worsens you need to be seen immediately.

## 2023-03-02 ENCOUNTER — Ambulatory Visit: Payer: Medicaid Other | Admitting: Family Medicine

## 2023-03-06 ENCOUNTER — Encounter: Payer: Self-pay | Admitting: Family Medicine

## 2023-03-06 ENCOUNTER — Ambulatory Visit (INDEPENDENT_AMBULATORY_CARE_PROVIDER_SITE_OTHER): Payer: Medicaid Other | Admitting: Family Medicine

## 2023-03-06 VITALS — BP 100/66 | HR 93 | Temp 98.7°F | Resp 18 | Ht 63.0 in | Wt 163.8 lb

## 2023-03-06 DIAGNOSIS — Z124 Encounter for screening for malignant neoplasm of cervix: Secondary | ICD-10-CM

## 2023-03-06 NOTE — Assessment & Plan Note (Signed)
Tolerated pap smear well.

## 2023-03-06 NOTE — Progress Notes (Signed)
   Established Patient Office Visit  Subjective   Patient ID: Dawn [name], female    DOB: 2001-03-16  Age: 22 y.o. MRN: [id number]  Chief Complaint  Patient presents with   Gynecologic Exam    HPI  Presents for pap only visit. Today is 22st birthday.      ROS    Objective:     BP 100/66   Pulse 93   Temp 98.7 F (37.1 C) (Oral)   Resp 18   Ht 5\' 3"  (1.6 m)   Wt 163 lb 12.8 oz (74.3 kg)   LMP 02/06/2023 (Approximate)   SpO2 97%   BMI 29.02 kg/m    Physical Exam Vitals and nursing note reviewed. Exam conducted with a chaperone present.  Constitutional:      General: She is not in acute distress.    Appearance: Normal appearance.  Pulmonary:     Effort: Pulmonary effort is normal.  Genitourinary:    General: Normal vulva.     Exam position: Lithotomy position.     Vagina: Normal.     Cervix: Normal.     Adnexa: Right adnexa normal and left adnexa normal.  Skin:    General: Skin is warm and dry.  Neurological:     General: No focal deficit present.     Mental Status: She is alert. Mental status is at baseline.  Psychiatric:        Mood and Affect: Mood normal.        Behavior: Behavior normal.        Thought Content: Thought content normal.        Judgment: Judgment normal.      No results found for any visits on 03/06/23.    The ASCVD Risk score (Arnett DK, et al., 2019) failed to calculate for the following reasons:   The 2019 ASCVD risk score is only valid for ages 36 to 28    Assessment & Plan:   Problem List Items Addressed This Visit     Screening for cervical cancer - Primary    Tolerated pap smear well.       Relevant Orders   Pap LB (liquid-based)  Agrees with plan of care discussed.  Questions answered.   Return if symptoms worsen or fail to improve.    Novella Olive, FNP

## 2023-03-15 LAB — PAP LB (LIQUID-BASED): PAP Smear Comment: 0

## 2023-03-15 LAB — HM PAP SMEAR: HM Pap smear: ABNORMAL

## 2023-03-16 ENCOUNTER — Telehealth: Payer: Self-pay | Admitting: Family Medicine

## 2023-03-16 ENCOUNTER — Other Ambulatory Visit: Payer: Self-pay | Admitting: Family Medicine

## 2023-03-16 DIAGNOSIS — R87613 High grade squamous intraepithelial lesion on cytologic smear of cervix (HGSIL): Secondary | ICD-10-CM | POA: Insufficient documentation

## 2023-03-16 NOTE — Telephone Encounter (Signed)
Pt Is needing a code for her insurance a provider code she would like a call abck I looked and only seen epic department code on the clinical page

## 2023-03-19 ENCOUNTER — Telehealth: Payer: Self-pay | Admitting: Family Medicine

## 2023-03-19 NOTE — Telephone Encounter (Signed)
Copied from CRM [id number]. Topic: Clinical - Lab/Test Results >> Mar 19, 2023 11:19 AM Alvino Blood C wrote: Reason for CRM: Selena Batten from [facility] is calling in to confirm abnormal result for PT"s pap smear  & she would like a call back at [phone].

## 2023-03-19 NOTE — Telephone Encounter (Signed)
Pap smear results addressed immediately after receiving results. Referral placed to GYN for further testing and treatment on 03/16/23.

## 2023-03-26 ENCOUNTER — Ambulatory Visit (HOSPITAL_COMMUNITY)
Admission: EM | Admit: 2023-03-26 | Discharge: 2023-03-26 | Disposition: A | Discharge status: Home / Self Care | Payer: Self-pay | Attending: Family Medicine | Admitting: Family Medicine

## 2023-03-26 ENCOUNTER — Encounter (HOSPITAL_COMMUNITY): Payer: Self-pay

## 2023-03-26 DIAGNOSIS — N3001 Acute cystitis with hematuria: Secondary | ICD-10-CM

## 2023-03-26 DIAGNOSIS — R809 Proteinuria, unspecified: Secondary | ICD-10-CM

## 2023-03-26 LAB — POCT URINALYSIS DIP (MANUAL ENTRY)
Glucose, UA: NEGATIVE mg/dL
Nitrite, UA: NEGATIVE
Protein Ur, POC: >=300 mg/dL — AB
Spec Grav, UA: 1.025 (ref 1.010–1.025)
Urobilinogen, UA: 1 U/dL
pH, UA: 6 (ref 5.0–8.0)

## 2023-03-26 MED ORDER — NITROFURANTOIN MONOHYD MACRO 100 MG PO CAPS: 100.0000 mg | ORAL_CAPSULE | Freq: Two times a day (BID) | ORAL | 0 refills | Status: DC

## 2023-03-26 NOTE — ED Triage Notes (Signed)
Patient having burning with urination, irritation/pain, frequency onset Friday. Saw some blood in the urine today.   Patient tried otc AZO with no relief. Tried the magnesium cranberry otc with no relief.

## 2023-03-26 NOTE — ED Provider Notes (Signed)
[facility]    CSN: [id number] Arrival date & time: 03/26/23  1012      History   Chief Complaint Chief Complaint  Patient presents with   Urinary Tract Infection    HPI Dawn [name] is a 22 y.o. female.   Patient is presenting with 1 week history of burning when she pees.  Patient notes that her menstrual cycle should start either tomorrow/today.  Patient notes that she saw blood in the urine yesterday.  Patient has tried over-the-counter remedies for her UTI-like symptoms without any relief.  Patient denies any fevers or chills.  Patient notes that there is some pain in her pelvic area bilaterally.  Patient denies any vaginal discharge or any foul-smelling discharge.  No other concerns at this time.   Urinary Tract Infection   Past Medical History:  Diagnosis Date   Allergy    Phreesia 09/18/2019   Constipation    Headache    Seasonal allergies     Patient Active Problem List   Diagnosis Date Noted   HSIL (high grade squamous intraepithelial lesion) on Pap smear of cervix 03/16/2023   Screening for cervical cancer 03/06/2023   PCOS (polycystic ovarian syndrome) 10/27/2022   RLQ abdominal pain 10/27/2022   Vaginal discharge 10/04/2022   Urinary urgency 10/04/2022   Unprotected sexual intercourse 10/04/2022   Encounter for female birth control 09/18/2022   Screening for viral disease 08/09/2022   Chest pain 06/28/2020   Swelling of both hands 09/18/2019   Bilateral swelling of feet 09/18/2019   Eosinophilic esophagitis 05/23/2018   Viral URI 05/20/2018   Vaccine reaction, initial encounter 03/18/2018   Generalized abdominal pain 01/10/2018   Gastroesophageal reflux disease 01/10/2018   History of palpitations 01/10/2018   Encounter for lipid screening for cardiovascular disease 09/22/2016   Migraine without aura and without status migrainosus, not intractable 01/05/2016   Poor posture 12/01/2015   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      Home Medications    Prior to Admission medications   Medication Sig Start Date End Date Taking? Authorizing Provider  nitrofurantoin, macrocrystal-monohydrate, (MACROBID) 100 MG capsule Take 1 capsule (100 mg total) by mouth 2 (two) times daily. 03/26/23  Yes Brenton Grills, MD  norgestimate-ethinyl estradiol (SPRINTEC 28) 0.25-35 MG-MCG tablet Take 1 tablet by mouth daily. 09/18/22  Yes Novella Olive, FNP  doxycycline (VIBRAMYCIN) 100 MG capsule Take 1 capsule (100 mg total) by mouth 2 (two) times daily. 02/25/23   Raspet, Noberto Retort, PA-C  promethazine-dextromethorphan (PROMETHAZINE-DM) 6.25-15 MG/5ML syrup Take 5 mLs by mouth 2 (two) times daily as needed. 02/25/23   Raspet, Noberto Retort, PA-C    Family History Family History  Problem Relation Age of Onset   Hypertension Mother    Liver disease Father    Hyperlipidemia Father    Cirrhosis Father    Gallbladder disease Sister    Cancer Maternal Grandmother    Cancer Maternal Grandfather    Cirrhosis Paternal Grandfather    GI problems Neg Hx     Social History Social History   Tobacco Use   Smoking status: Never   Smokeless tobacco: Never  Vaping Use   Vaping status: Never Used  Substance Use Topics  Alcohol use: No   Drug use: No     Allergies   Salami [pickled meat]   Review of Systems Review of Systems   Physical Exam Triage Vital Signs ED Triage Vitals  Encounter Vitals Group     BP 03/26/23 1200 104/69     Systolic BP Percentile --      Diastolic BP Percentile --      Pulse Rate 03/26/23 1200 91     Resp 03/26/23 1200 16     Temp 03/26/23 1200 99 F (37.2 C)     Temp Source 03/26/23 1200 Oral     SpO2 03/26/23 1200 98 %     Weight 03/26/23 1159 163 lb (73.9 kg)     Height 03/26/23 1159 5\' 3"   (1.6 m)     Head Circumference --      Peak Flow --      Pain Score 03/26/23 1158 6     Pain Loc --      Pain Education --      Exclude from Growth Chart --    No data found.  Updated Vital Signs BP 104/69 (BP Location: Right Arm)   Pulse 91   Temp 99 F (37.2 C) (Oral)   Resp 16   Ht 5\' 3"  (1.6 m)   Wt 73.9 kg   LMP 03/05/2023 (Approximate)   SpO2 98%   BMI 28.87 kg/m   Visual Acuity Right Eye Distance:   Left Eye Distance:   Bilateral Distance:    Right Eye Near:   Left Eye Near:    Bilateral Near:     Physical Exam Constitutional:      Appearance: Normal appearance.  Cardiovascular:     Rate and Rhythm: Normal rate and regular rhythm.  Neurological:     Mental Status: She is alert.      UC Treatments / Results  Labs (all labs ordered are listed, but only abnormal results are displayed) Labs Reviewed  POCT URINALYSIS DIP (MANUAL ENTRY) - Abnormal; Notable for the following components:      Result Value   Color, UA red (*)    Clarity, UA cloudy (*)    Bilirubin, UA moderate (*)    Ketones, POC UA trace (5) (*)    Blood, UA large (*)    Protein Ur, POC >=300 (*)    Leukocytes, UA Large (3+) (*)    All other components within normal limits    EKG   Radiology No results found.  Procedures Procedures (including critical care time)  Medications Ordered in UC Medications - No data to display  Initial Impression / Assessment and Plan / UC Course  I have reviewed the triage vital signs and the nursing notes.  Pertinent labs & imaging results that were available during my care of the patient were reviewed by me and considered in my medical decision making (see chart for details).     Patient's urine did show large leukocytes as well as some increased protein.  Discussed with patient that we will go ahead and treat her for UTI at this time.  Patient did have a recent viral/walking pneumonia illness, less likely that patient's urine analysis is  indicative of some sort of glomerulonephritis but we will keep that in mind.  Will give patient Macrobid for the next 5 days.  Patient vies to follow-up if no improvement. Final Clinical Impressions(s) / UC Diagnoses   Final diagnoses:  Acute cystitis with hematuria  Discharge Instructions      Please take your antibiotics as directed  Please follow-up if you feel like there is no improvement     ED Prescriptions     Medication Sig Dispense Auth. Provider   nitrofurantoin, macrocrystal-monohydrate, (MACROBID) 100 MG capsule Take 1 capsule (100 mg total) by mouth 2 (two) times daily. 10 capsule Brenton Grills, MD      PDMP not reviewed this encounter.   Brenton Grills, MD 03/26/23 1239

## 2023-03-26 NOTE — Discharge Instructions (Addendum)
Please take your antibiotics as directed  Please follow-up if you feel like there is no improvement

## 2023-04-03 ENCOUNTER — Encounter: Payer: Self-pay | Admitting: Family Medicine

## 2023-04-25 ENCOUNTER — Encounter: Payer: Self-pay | Admitting: Obstetrics and Gynecology

## 2023-05-15 NOTE — Progress Notes (Signed)
    GYNECOLOGY OFFICE COLPOSCOPY PROCEDURE NOTE  23 y.o. No obstetric history on file. here for colposcopy for  HSIL  pap smear on 03/06/23.   Pregnancy test:  negative Gardasil:  completed. Discussed role for HPV in cervical dysplasia, need for surveillance.  Informed consent and review of risks, benefit and alternatives performed. Written consent given.   Speculum inserted into patient's vagina assuring full view of cervix and vaginal walls. 3 swabs of vinegar solution applied to the cervix and vaginal walls and colposcope was used to observe both the cervix and vaginal walls.   Colposcopy adequate? Yes  SCJ clearly visible circumferentially. All glandular tissue had acetowhite changes, circumferential acetowhite epithelium throughout transformation zone. One internal border superiorly within the glandular tissue that was thicker white at 12-3 o'clock; corresponding biopsies obtained (12-1, 3, 6).   All specimens were labeled and sent to pathology.  Monsel's applied to biopsy sites for good hemostasis and speculum removed.  Pt tolerated well with minimal pain and bleeding.   Patient was given post procedure instructions.  Will follow up pathology and manage accordingly; patient will be contacted with results and recommendations.  Routine preventative health maintenance measures emphasized.  Kieth Carolin, MD Obstetrician & Gynecologist, [facility] for [employer], [facility]

## 2023-05-16 ENCOUNTER — Encounter: Payer: Self-pay | Admitting: Obstetrics and Gynecology

## 2023-05-16 ENCOUNTER — Other Ambulatory Visit (HOSPITAL_COMMUNITY)
Admission: RE | Admit: 2023-05-16 | Discharge: 2023-05-16 | Disposition: A | Discharge status: Home / Self Care | Payer: Commercial Managed Care - PPO | Source: Ambulatory Visit | Attending: Obstetrics and Gynecology | Admitting: Obstetrics and Gynecology

## 2023-05-16 ENCOUNTER — Ambulatory Visit: Payer: Commercial Managed Care - PPO | Admitting: Obstetrics and Gynecology

## 2023-05-16 VITALS — BP 118/73 | HR 97 | Ht 63.0 in | Wt 154.0 lb

## 2023-05-16 DIAGNOSIS — Z3202 Encounter for pregnancy test, result negative: Secondary | ICD-10-CM

## 2023-05-16 DIAGNOSIS — Z01812 Encounter for preprocedural laboratory examination: Secondary | ICD-10-CM

## 2023-05-16 DIAGNOSIS — R87613 High grade squamous intraepithelial lesion on cytologic smear of cervix (HGSIL): Secondary | ICD-10-CM

## 2023-05-16 DIAGNOSIS — R87612 Low grade squamous intraepithelial lesion on cytologic smear of cervix (LGSIL): Secondary | ICD-10-CM | POA: Insufficient documentation

## 2023-05-16 DIAGNOSIS — N871 Moderate cervical dysplasia: Secondary | ICD-10-CM | POA: Diagnosis not present

## 2023-05-16 LAB — POCT URINE PREGNANCY: Preg Test, Ur: NEGATIVE

## 2023-05-16 NOTE — Patient Instructions (Signed)
 It is normal to have cramping and bleeding for up to 2 weeks. You should feel better every day.   Please call us  if you have any severe pain, bleeding that soaks more than 1 pad in a hour, have fevers, or feel like you're going to pass out.   You can take tylenol  1000mg  every 8 hours and ibuprofen  800mg  every 8 hours as needed for pain. It is ok to take both at the same time.

## 2023-05-16 NOTE — Addendum Note (Signed)
 Addended by: April Bayard on: 05/16/2023 10:21 AM   Modules accepted: Orders

## 2023-05-18 ENCOUNTER — Encounter: Payer: Self-pay | Admitting: Obstetrics and Gynecology

## 2023-05-18 LAB — SURGICAL PATHOLOGY

## 2023-08-06 ENCOUNTER — Ambulatory Visit: Admitting: Family Medicine

## 2023-08-16 ENCOUNTER — Other Ambulatory Visit: Payer: Self-pay | Admitting: Family Medicine

## 2023-08-16 DIAGNOSIS — Z30019 Encounter for initial prescription of contraceptives, unspecified: Secondary | ICD-10-CM

## 2023-08-16 MED ORDER — NORGESTIMATE-ETH ESTRADIOL 0.25-35 MG-MCG PO TABS: 1.0000 | ORAL_TABLET | Freq: Every day | ORAL | 1 refills | Status: DC

## 2023-09-11 ENCOUNTER — Encounter: Payer: Self-pay | Admitting: Family Medicine

## 2023-09-11 ENCOUNTER — Ambulatory Visit: Admitting: Family Medicine

## 2023-09-11 ENCOUNTER — Other Ambulatory Visit (HOSPITAL_COMMUNITY): Payer: Self-pay

## 2023-09-11 VITALS — BP 106/71 | HR 98 | Temp 99.1°F | Ht 63.0 in | Wt 154.0 lb

## 2023-09-11 DIAGNOSIS — Z30019 Encounter for initial prescription of contraceptives, unspecified: Secondary | ICD-10-CM

## 2023-09-11 DIAGNOSIS — R87613 High grade squamous intraepithelial lesion on cytologic smear of cervix (HGSIL): Secondary | ICD-10-CM

## 2023-09-11 MED ORDER — NORGESTIMATE-ETH ESTRADIOL 0.25-35 MG-MCG PO TABS
1.0000 | ORAL_TABLET | Freq: Every day | ORAL | 3 refills | Status: AC
  Filled 2023-09-11: qty 84, 84d supply, fill #0
  Filled 2023-12-02: qty 84, 84d supply, fill #1
  Filled 2024-01-23 – 2024-02-21 (×2): qty 84, 84d supply, fill #2
  Filled 2024-04-26: qty 84, 84d supply, fill #3

## 2023-09-11 NOTE — Assessment & Plan Note (Signed)
 Colposcopy in [place] per Dr. Donetta Furl. Due for follow-up in August. Encouraged to call today to get that scheduled.

## 2023-09-11 NOTE — Assessment & Plan Note (Addendum)
 Tolerating oral contraceptives well. Has not been sexually active since February. Has not missed any doses.  Refill sent today.  Pap smear per GYN. Follow-up for CPE in 2 weeks.

## 2023-09-11 NOTE — Progress Notes (Signed)
   Established Patient Office Visit  Subjective   Patient ID: Dawn [name], female    DOB: 2000-07-04  Age: 23 y.o. MRN: [id number]  Chief Complaint  Patient presents with   Medication refill    Presents for refill on birth control.  Has not been sexually active since having colonoscopy In February by Dr. Donetta Furl.  Has not missed pills. No risk of pregnancy. No side effects reported. Occasional headaches that resolve quickly.  Abnormal pap smear: followed by GYN. Due for follow-up in August.          ROS    Objective:     BP 106/71 (BP Location: Left Arm, Patient Position: Sitting, Cuff Size: Normal)   Pulse 98   Temp 99.1 F (37.3 C) (Oral)   Ht 5\' 3"  (1.6 m)   Wt 154 lb (69.9 kg)   LMP 08/21/2023 (Approximate)   SpO2 99%   BMI 27.28 kg/m    Physical Exam Vitals and nursing note reviewed.  Constitutional:      General: She is not in acute distress.    Appearance: Normal appearance.  Cardiovascular:     Rate and Rhythm: Regular rhythm.     Heart sounds: Normal heart sounds.  Pulmonary:     Effort: Pulmonary effort is normal.     Breath sounds: Normal breath sounds.  Skin:    General: Skin is warm and dry.  Neurological:     General: No focal deficit present.     Mental Status: She is alert. Mental status is at baseline.  Psychiatric:        Mood and Affect: Mood normal.        Behavior: Behavior normal.        Thought Content: Thought content normal.        Judgment: Judgment normal.     No results found for any visits on 09/11/23.    The ASCVD Risk score (Arnett DK, et al., 2019) failed to calculate for the following reasons:   The 2019 ASCVD risk score is only valid for ages 43 to 80    Assessment & Plan:   Problem List Items Addressed This Visit     Encounter for female birth control   Tolerating oral contraceptives well. Has not been sexually active since February. Has not missed any doses.  Refill sent today.  Pap smear  per GYN. Follow-up for CPE in 2 weeks.        Relevant Medications   norgestimate -ethinyl estradiol  (ESTARYLLA) 0.25-35 MG-MCG tablet   HSIL (high grade squamous intraepithelial lesion) on Pap smear of cervix - Primary   Colposcopy in [place] per Dr. Donetta Furl. Due for follow-up in August. Encouraged to call today to get that scheduled.      Agrees with plan of care discussed.  Questions answered.   Return in about 15 days (around 09/26/2023) for CPE with labs.    Mickiel Albany, FNP

## 2023-09-11 NOTE — Patient Instructions (Signed)
 Call Dr. Donetta Furl for follow-up for abnormal pap.

## 2023-09-26 ENCOUNTER — Ambulatory Visit (INDEPENDENT_AMBULATORY_CARE_PROVIDER_SITE_OTHER): Admitting: Family Medicine

## 2023-09-26 ENCOUNTER — Encounter: Payer: Self-pay | Admitting: Family Medicine

## 2023-09-26 VITALS — BP 95/61 | HR 88 | Ht 63.0 in | Wt 158.6 lb

## 2023-09-26 DIAGNOSIS — Z13220 Encounter for screening for lipoid disorders: Secondary | ICD-10-CM

## 2023-09-26 DIAGNOSIS — Z13228 Encounter for screening for other metabolic disorders: Secondary | ICD-10-CM | POA: Diagnosis not present

## 2023-09-26 DIAGNOSIS — Z23 Encounter for immunization: Secondary | ICD-10-CM

## 2023-09-26 DIAGNOSIS — Z0000 Encounter for general adult medical examination without abnormal findings: Secondary | ICD-10-CM

## 2023-09-26 DIAGNOSIS — Z136 Encounter for screening for cardiovascular disorders: Secondary | ICD-10-CM

## 2023-09-26 DIAGNOSIS — R635 Abnormal weight gain: Secondary | ICD-10-CM

## 2023-09-26 NOTE — Progress Notes (Signed)
 Complete physical exam  Patient: Dawn [name]   DOB: 01/14/2001   22 y.o. Female  MRN: [id number]  Subjective:    Chief Complaint  Patient presents with   Annual Exam    Dawn [name] is a 23 y.o. female who presents today for a complete physical exam. She reports consuming a general diet. walking She generally feels well. She reports sleeping well. She does not have additional problems to discuss today.    Most recent fall risk assessment:    08/09/2022    3:04 PM  Fall Risk   Falls in the past year? 0  Number falls in past yr: 0  Injury with Fall? 0  Risk for fall due to : History of fall(s)  Follow up Falls evaluation completed     Most recent depression screenings:    09/26/2023   10:10 AM 08/09/2022    3:04 PM  PHQ 2/9 Scores  PHQ - 2 Score 0 0  PHQ- 9 Score 2 3    Vision:Not within last year  and Dental: No current dental problems and Receives regular dental care    Patient Care Team: Mickiel Albany, FNP as PCP - General (Family Medicine) Sheryle Donning, MD as PCP - Cardiology (Cardiology) Lyndol Santee, NP as Nurse Practitioner (Neurology) Elene Griffes, MD as Consulting Physician (Pediatric Gastroenterology)   Outpatient Medications Prior to Visit  Medication Sig   norgestimate -ethinyl estradiol  (ESTARYLLA ) 0.25-35 MG-MCG tablet Take 1 tablet by mouth daily.   No facility-administered medications prior to visit.    ROS        Objective:     BP 95/61 (BP Location: Left Arm, Patient Position: Sitting, Cuff Size: Normal)   Pulse 88   Ht 5' 3 (1.6 m)   Wt 158 lb 9.6 oz (71.9 kg)   LMP 08/21/2023 (Approximate)   SpO2 98%   BMI 28.09 kg/m    Physical Exam Vitals and nursing note reviewed.  Constitutional:      General: She is not in acute distress.    Appearance: Normal appearance.  HENT:     Right Ear: Tympanic membrane normal.     Left Ear: Tympanic membrane normal.     Nose: Nose normal.      Mouth/Throat:     Mouth: Mucous membranes are moist.     Pharynx: Oropharynx is clear.   Eyes:     Extraocular Movements: Extraocular movements intact.   Neck:     Thyroid : No thyroid  tenderness.   Cardiovascular:     Rate and Rhythm: Normal rate and regular rhythm.     Pulses:          Radial pulses are 2+ on the right side and 2+ on the left side.     Heart sounds: Normal heart sounds, S1 normal and S2 normal.  Pulmonary:     Effort: Pulmonary effort is normal.     Breath sounds: Normal breath sounds.  Abdominal:     General: Bowel sounds are normal.     Palpations: Abdomen is soft.     Tenderness: There is no abdominal tenderness.   Musculoskeletal:        General: Normal range of motion.     Cervical back: Normal range of motion.     Right lower leg: No edema.     Left lower leg: No edema.  Lymphadenopathy:     Cervical:     Right cervical: No superficial cervical adenopathy.    Left  cervical: No superficial cervical adenopathy.   Skin:    General: Skin is warm and dry.   Neurological:     General: No focal deficit present.     Mental Status: She is alert. Mental status is at baseline.   Psychiatric:        Mood and Affect: Mood normal.        Behavior: Behavior normal.        Thought Content: Thought content normal.        Judgment: Judgment normal.      No results found for any visits on 09/26/23.     Assessment & Plan:    Routine Health Maintenance and Physical Exam  Immunization History  Administered Date(s) Administered   DTaP 04/24/2002, 06/26/2002, 08/27/2002, 07/16/2003, 07/06/2006   HIB (PRP-OMP) 04/24/2002, 06/26/2002, 08/27/2002, 07/16/2003   HPV 9-valent 02/04/2014   HPV Quadrivalent 08/05/2013, 09/19/2013   Hepatitis A 06/02/2005, 01/31/2006   Hepatitis B 01[birth year]21[birth year]02, 04/24/2002, 12/03/2002   IPV 04/24/2002, 06/26/2002, 03/18/2003, 07/06/2006   Influenza Nasal 06/02/2005   Influenza,inj,Quad PF,6+ Mos 06/19/2015, 01/24/2016, 02/15/2017,  12/25/2017, 02/04/2019, 03/16/2021   Influenza,inj,quad, With Preservative 01/01/2014   Influenza-Unspecified 02/11/2003, 03/18/2003, 02/15/2004, 06/02/2005, 01/31/2006, 03/28/2007, 03/16/2008, 04/14/2009, 01/06/2010, 03/25/2011, 02/08/2023   MMR 03/18/2003, 07/06/2006   Meningococcal B, OMV 09/26/2023   Meningococcal Conjugate 08/05/2013, 03/15/2018   PFIZER(Purple Top)SARS-COV-2 Vaccination 09/29/2019, 10/20/2019   Pneumococcal Conjugate-13 12/03/2002, 07/16/2003   Pneumococcal-Unspecified 04/24/2002, 06/26/2002   Tdap 08/05/2013, 12/20/2022   Varicella 03/18/2003, 07/06/2006    Health Maintenance  Topic Date Due   Cervical Cancer Screening (HPV/Pap Cotest)  11/13/2023   COVID-19 Vaccine (3 - 2024-25 season) 10/12/2023 (Originally 12/10/2022)   CHLAMYDIA SCREENING  10/04/2023   INFLUENZA VACCINE  11/09/2023   Meningococcal B Vaccine (2 of 2 - Bexsero SCDM 2-dose series) 03/27/2024   DTaP/Tdap/Td (8 - Td or Tdap) 12/19/2032   Pneumococcal Vaccine 68-70 Years old  Completed   HPV VACCINES  Completed   Hepatitis C Screening  Completed   HIV Screening  Completed    Discussed health benefits of physical activity, and encouraged her to engage in regular exercise appropriate for her age and condition.  Annual physical exam -     CBC  Encounter for lipid screening for cardiovascular disease -     Comprehensive metabolic panel with GFR  Encounter for screening for metabolic disorder -     Lipid panel  Weight gain -     TSH + free T4  Encounter for administration of vaccine -     Meningococcal B, OMV      Routine labs ordered.  HCM reviewed/discussed. Mengio B vaccine today.  Sees GYN for pap smear. Up to date with T dap.  Anticipatory guidance regarding healthy weight, lifestyle and choices given. Recommend healthy diet.  Recommend approximately 150 minutes/week of moderate intensity exercise. Resistance training is good for building muscles and for bone health. Muscle mass  helps to increase our metabolism and to burn more calories at rest.  Limit alcohol consumption: no more than one drink per day for women and 2 drinks per day for me. Recommend regular dental and vision exams. Always use seatbelt/lap and shoulder restraints. Recommend using smoke alarms and checking batteries at least twice a year. Recommend using sunscreen when outside.  Please know that I am here to help you with all of your health care goals and am happy to work with you to find a solution that works best for you.  The greatest advice I  have received with any changes in life are to take it one step at a time, that even means if all you can focus on is the next 60 seconds, then do that and celebrate your victories.  With any changes in life, you will have set backs, and that is OK. The important thing to remember is, if you have a set back, it is not a failure, it is an opportunity to try again! Agrees with plan of care discussed.  Questions answered.      Return in about 1 year (around 09/26/2024) for CPE with labs.     Mickiel Albany, FNP

## 2023-09-27 ENCOUNTER — Ambulatory Visit: Payer: Self-pay | Admitting: Family Medicine

## 2023-09-27 LAB — LIPID PANEL
Chol/HDL Ratio: 3.2 ratio (ref 0.0–4.4)
Cholesterol, Total: 188 mg/dL (ref 100–199)
HDL: 58 mg/dL (ref >39)
LDL Chol Calc (NIH): 108 mg/dL — ABNORMAL HIGH (ref 0–99)
Triglycerides: 122 mg/dL (ref 0–149)
VLDL Cholesterol Cal: 22 mg/dL (ref 5–40)

## 2023-09-27 LAB — CBC
Hematocrit: 43.2 % (ref 34.0–46.6)
Hemoglobin: 14 g/dL (ref 11.1–15.9)
MCH: 31 pg (ref 26.6–33.0)
MCHC: 32.4 g/dL (ref 31.5–35.7)
MCV: 96 fL (ref 79–97)
Platelets: 301 10*3/uL (ref 150–450)
RBC: 4.52 x10E6/uL (ref 3.77–5.28)
RDW: 12 % (ref 11.7–15.4)
WBC: 5.9 10*3/uL (ref 3.4–10.8)

## 2023-09-27 LAB — COMPREHENSIVE METABOLIC PANEL WITH GFR
ALT: 10 IU/L (ref 0–32)
AST: 13 IU/L (ref 0–40)
Albumin: 4.3 g/dL (ref 4.0–5.0)
Alkaline Phosphatase: 58 IU/L (ref 44–121)
BUN/Creatinine Ratio: 16 (ref 9–23)
BUN: 11 mg/dL (ref 6–20)
Bilirubin Total: 0.3 mg/dL (ref 0.0–1.2)
CO2: 21 mmol/L (ref 20–29)
Calcium: 9.2 mg/dL (ref 8.7–10.2)
Chloride: 101 mmol/L (ref 96–106)
Creatinine, Ser: 0.68 mg/dL (ref 0.57–1.00)
Globulin, Total: 2.2 g/dL (ref 1.5–4.5)
Glucose: 84 mg/dL (ref 70–99)
Potassium: 4.1 mmol/L (ref 3.5–5.2)
Sodium: 137 mmol/L (ref 134–144)
Total Protein: 6.5 g/dL (ref 6.0–8.5)
eGFR: 127 mL/min/{1.73_m2} (ref >59)

## 2023-09-27 LAB — TSH+FREE T4
Free T4: 1.26 ng/dL (ref 0.82–1.77)
TSH: 1.86 u[IU]/mL (ref 0.450–4.500)

## 2023-10-02 ENCOUNTER — Other Ambulatory Visit: Payer: Self-pay | Admitting: Family Medicine

## 2023-10-02 DIAGNOSIS — Z30019 Encounter for initial prescription of contraceptives, unspecified: Secondary | ICD-10-CM

## 2023-11-28 ENCOUNTER — Other Ambulatory Visit (HOSPITAL_COMMUNITY)
Admission: RE | Admit: 2023-11-28 | Discharge: 2023-11-28 | Disposition: A | Discharge status: Home / Self Care | Source: Ambulatory Visit | Attending: Obstetrics and Gynecology | Admitting: Obstetrics and Gynecology

## 2023-11-28 ENCOUNTER — Encounter: Payer: Self-pay | Admitting: Obstetrics and Gynecology

## 2023-11-28 ENCOUNTER — Ambulatory Visit: Admitting: Obstetrics and Gynecology

## 2023-11-28 VITALS — BP 106/69 | HR 78 | Ht 62.0 in | Wt 156.0 lb

## 2023-11-28 DIAGNOSIS — N871 Moderate cervical dysplasia: Secondary | ICD-10-CM | POA: Insufficient documentation

## 2023-11-28 DIAGNOSIS — R87613 High grade squamous intraepithelial lesion on cytologic smear of cervix (HGSIL): Secondary | ICD-10-CM

## 2023-11-28 DIAGNOSIS — Z3202 Encounter for pregnancy test, result negative: Secondary | ICD-10-CM

## 2023-11-28 LAB — POCT URINE PREGNANCY: Preg Test, Ur: NEGATIVE

## 2023-11-28 NOTE — Progress Notes (Unsigned)
    GYNECOLOGY OFFICE COLPOSCOPY PROCEDURE NOTE  23 y.o. 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 Discussed role for HPV in cervical dysplasia, need for surveillance.  Informed consent and review of risks, benefit and alternatives performed. Written consent given.   Speculum inserted into patient's vagina assuring full view of cervix and vaginal walls. 3 swabs of vinegar solution applied to the cervix and vaginal walls and colposcope was used to observe both the cervix and vaginal walls.   Colposcopy adequate? Yes Circumferential thin AWE with papillary/warty changes. Thick AWE at 11 and 6-7 o'clock. Thin with indistinct border at 12 o'clock. Previously visualized lesion with internal border was not appreciated today. Biopsied 11, 12 and 7 o'clock. Pap collected prior to colpo.   All specimens were labeled and sent to pathology.  Monsel's applied to biopsy sites for good hemostasis and speculum removed.  Pt tolerated well with minimal pain and bleeding.   Patient was given post procedure instructions.  Will follow up pathology and manage accordingly; patient will be contacted with results and recommendations.  Routine preventative health maintenance measures emphasized.  Kieth Carolin, MD Obstetrician & Gynecologist, [facility] for [employer], [facility]

## 2023-11-28 NOTE — Patient Instructions (Signed)
 It is normal to have cramping and bleeding for the next 5-7 days. You should feel better every day.   Please call us  if you have any severe pain, bleeding that soaks more than 1 pad in a hour, have fevers, or feel like you're going to pass out.   You can take tylenol  1000mg  every 8 hours and ibuprofen  800mg  every 8 hours as needed for pain. It is ok to take both at the same time.

## 2023-11-30 ENCOUNTER — Ambulatory Visit: Payer: Self-pay | Admitting: Obstetrics and Gynecology

## 2023-11-30 LAB — SURGICAL PATHOLOGY

## 2023-12-03 ENCOUNTER — Other Ambulatory Visit (HOSPITAL_COMMUNITY): Payer: Self-pay

## 2023-12-03 LAB — CYTOLOGY - PAP
Chlamydia: NEGATIVE
Comment: NEGATIVE
Comment: NEGATIVE
Comment: NORMAL
Diagnosis: NEGATIVE
High risk HPV: NEGATIVE
Neisseria Gonorrhea: NEGATIVE

## 2024-01-24 ENCOUNTER — Other Ambulatory Visit (HOSPITAL_COMMUNITY): Payer: Self-pay

## 2024-02-22 ENCOUNTER — Other Ambulatory Visit (HOSPITAL_COMMUNITY): Payer: Self-pay

## 2024-04-28 ENCOUNTER — Other Ambulatory Visit (HOSPITAL_COMMUNITY): Payer: Self-pay

## 2024-05-13 ENCOUNTER — Encounter: Payer: Self-pay | Admitting: Obstetrics and Gynecology

## 2024-07-31 ENCOUNTER — Encounter: Admitting: Family Medicine
